# Patient Record
Sex: Female | Born: 1955
Health system: Southern US, Community
[De-identification: ages and names within clinical notes are randomized; demographics above are authoritative.]

## PROBLEM LIST (undated history)

## (undated) DIAGNOSIS — R011 Cardiac murmur, unspecified: Secondary | ICD-10-CM

## (undated) DIAGNOSIS — M810 Age-related osteoporosis without current pathological fracture: Secondary | ICD-10-CM

## (undated) DIAGNOSIS — I10 Essential (primary) hypertension: Secondary | ICD-10-CM

## (undated) DIAGNOSIS — E785 Hyperlipidemia, unspecified: Secondary | ICD-10-CM

## (undated) HISTORY — PX: OTHER SURGICAL HISTORY: SHX169

## (undated) HISTORY — DX: Hyperlipidemia, unspecified: E78.5

## (undated) HISTORY — DX: Age-related osteoporosis without current pathological fracture: M81.0

## (undated) HISTORY — DX: Essential (primary) hypertension: I10

## (undated) HISTORY — PX: BREAST EXCISIONAL BIOPSY: SUR124

## (undated) HISTORY — PX: COLONOSCOPY: SHX174

## (undated) HISTORY — DX: Cardiac murmur, unspecified: R01.1

---

## 2009-02-26 HISTORY — PX: COLONOSCOPY: SHX174

## 2012-09-06 ENCOUNTER — Other Ambulatory Visit (INDEPENDENT_AMBULATORY_CARE_PROVIDER_SITE_OTHER): Payer: BC Managed Care – PPO

## 2012-09-06 DIAGNOSIS — Z0000 Encounter for general adult medical examination without abnormal findings: Secondary | ICD-10-CM

## 2012-09-06 LAB — COMPLETE METABOLIC PANEL WITH GFR
AST: 17 U/L (ref 0–37)
Albumin: 4 g/dL (ref 3.5–5.2)
Alkaline Phosphatase: 55 U/L (ref 39–117)
BUN: 12 mg/dL (ref 6–23)
Calcium: 9.5 mg/dL (ref 8.4–10.5)
Chloride: 104 mEq/L (ref 96–112)
Creat: 0.68 mg/dL (ref 0.50–1.10)
GFR, Est Non African American: >89 mL/min
Glucose, Bld: 92 mg/dL (ref 70–99)
Potassium: 4.4 mEq/L (ref 3.5–5.3)

## 2012-09-06 NOTE — Progress Notes (Signed)
Patient came in for labs only.

## 2012-09-09 LAB — NMR LIPOPROFILE WITH LIPIDS
HDL Size: 10 nm (ref >=9.2)
LDL (calc): 87 mg/dL (ref <100)
LDL Particle Number: 976 nmol/L (ref <1000)
LDL Size: 20.8 nm (ref >20.5)
LP-IR Score: 32 (ref <=45)
Large VLDL-P: 1.8 nmol/L (ref <=2.7)
Small LDL Particle Number: 379 nmol/L (ref <=527)
VLDL Size: 46.7 nm — ABNORMAL HIGH (ref <=46.6)

## 2012-10-03 ENCOUNTER — Other Ambulatory Visit: Payer: Self-pay | Admitting: Nurse Practitioner

## 2012-10-25 ENCOUNTER — Encounter: Payer: Self-pay | Admitting: Nurse Practitioner

## 2012-10-25 ENCOUNTER — Ambulatory Visit (INDEPENDENT_AMBULATORY_CARE_PROVIDER_SITE_OTHER): Payer: BC Managed Care – PPO | Admitting: Nurse Practitioner

## 2012-10-25 VITALS — BP 129/66 | HR 69 | Temp 99.2°F | Ht 65.0 in | Wt 148.0 lb

## 2012-10-25 DIAGNOSIS — I10 Essential (primary) hypertension: Secondary | ICD-10-CM | POA: Insufficient documentation

## 2012-10-25 DIAGNOSIS — Z716 Tobacco abuse counseling: Secondary | ICD-10-CM

## 2012-10-25 DIAGNOSIS — E785 Hyperlipidemia, unspecified: Secondary | ICD-10-CM

## 2012-10-25 DIAGNOSIS — Z7189 Other specified counseling: Secondary | ICD-10-CM

## 2012-10-25 DIAGNOSIS — Z13820 Encounter for screening for osteoporosis: Secondary | ICD-10-CM

## 2012-10-25 DIAGNOSIS — E7800 Pure hypercholesterolemia, unspecified: Secondary | ICD-10-CM | POA: Insufficient documentation

## 2012-10-25 LAB — HM DEXA SCAN: HM DEXA SCAN: NORMAL

## 2012-10-25 MED ORDER — VARENICLINE TARTRATE 1 MG PO TABS: 1.0000 mg | ORAL_TABLET | Freq: Two times a day (BID) | ORAL | Status: DC

## 2012-10-25 MED ORDER — SIMVASTATIN 40 MG PO TABS: 40.0000 mg | ORAL_TABLET | Freq: Every day | ORAL | Status: DC

## 2012-10-25 MED ORDER — LISINOPRIL-HYDROCHLOROTHIAZIDE 10-12.5 MG PO TABS: 1.0000 | ORAL_TABLET | Freq: Every day | ORAL | Status: DC

## 2012-10-25 MED ORDER — VARENICLINE TARTRATE 0.5 MG X 11 & 1 MG X 42 PO MISC: ORAL | Status: DC

## 2012-10-25 NOTE — Patient Instructions (Signed)

## 2012-10-25 NOTE — Progress Notes (Signed)
Subjective:    Patient ID: Julie Wiggins, female    DOB: May 25, 1955, 57 y.o.   MRN: [id number]  Hypertension This is a chronic problem. The current episode started more than 1 year ago. The problem has been resolved since onset. The problem is controlled. Pertinent negatives include no blurred vision, chest pain, headaches, neck pain, orthopnea, palpitations, peripheral edema or shortness of breath. Risk factors for coronary artery disease include dyslipidemia. Past treatments include ACE inhibitors and diuretics. The current treatment provides mild improvement.  Hyperlipidemia This is a chronic problem. The current episode started more than 1 year ago. The problem is uncontrolled. Recent lipid tests were reviewed and are low. She has no history of diabetes, hypothyroidism, liver disease, obesity or nephrotic syndrome. Pertinent negatives include no chest pain or shortness of breath. Current antihyperlipidemic treatment includes statins. The current treatment provides moderate improvement of lipids. Compliance problems include adherence to diet.  Risk factors for coronary artery disease include hypertension.      Review of Systems  HENT: Negative for neck pain.   Eyes: Negative for blurred vision.  Respiratory: Negative for shortness of breath.   Cardiovascular: Negative for chest pain, palpitations and orthopnea.  Neurological: Negative for headaches.  All other systems reviewed and are negative.       Objective:   Physical Exam  Constitutional: She is oriented to person, place, and time. She appears well-developed and well-nourished.  HENT:  Nose: Nose normal.  Mouth/Throat: Oropharynx is clear and moist.  Eyes: EOM are normal.  Neck: Trachea normal, normal range of motion and full passive range of motion without pain. Neck supple. No JVD present. Carotid bruit is not present. No thyromegaly present.  Cardiovascular: Normal rate, regular rhythm, normal heart sounds and intact distal  pulses.  Exam reveals no gallop and no friction rub.   No murmur heard. Pulmonary/Chest: Effort normal and breath sounds normal.  Abdominal: Soft. Bowel sounds are normal. She exhibits no distension and no mass. There is no tenderness.  Musculoskeletal: Normal range of motion.  Lymphadenopathy:    She has no cervical adenopathy.  Neurological: She is alert and oriented to person, place, and time. She has normal reflexes.  Skin: Skin is warm and dry.  Psychiatric: She has a normal mood and affect. Her behavior is normal. Judgment and thought content normal.    BP 129/66  Pulse 69  Temp(Src) 99.2 F (37.3 C) (Oral)  Ht 5\' 5"  (1.651 m)  Wt 148 lb (67.132 kg)  BMI 24.63 kg/m2 Results for orders placed in visit on 09/06/12  COMPLETE METABOLIC PANEL WITH GFR      Result Value Range   Sodium 139  135 - 145 mEq/L   Potassium 4.4  3.5 - 5.3 mEq/L   Chloride 104  96 - 112 mEq/L   CO2 29  19 - 32 mEq/L   Glucose, Bld 92  70 - 99 mg/dL   BUN 12  6 - 23 mg/dL   Creat 4.09  8.11 - 9.14 mg/dL   Total Bilirubin 0.4  0.3 - 1.2 mg/dL   Alkaline Phosphatase 55  39 - 117 U/L   AST 17  0 - 37 U/L   ALT 10  0 - 35 U/L   Total Protein 6.7  6.0 - 8.3 g/dL   Albumin 4.0  3.5 - 5.2 g/dL   Calcium 9.5  8.4 - 78.2 mg/dL   GFR, Est African American >89     GFR, Est Non African American >89  NMR LIPOPROFILE WITH LIPIDS      Result Value Range   LDL Particle Number [id number]  <1000 nmol/L   LDL (calc) 87  <100 mg/dL   HDL-C 66  >=95 mg/dL   Triglycerides 62  <621 mg/dL   Cholesterol, Total 308  <200 mg/dL   HDL Particle Number [id number]  >=84.6 umol/L   Large HDL-P 12.8  >=4.8 umol/L   Large VLDL-P 1.8  <=2.7 nmol/L   Small LDL Particle Number [id number]  <=527 nmol/L   LDL Size 20.8  >20.5 nm   HDL Size 10.0  >=9.2 nm   VLDL Size 46.7 (*) <=46.6 nm   LP-IR Score 32  <=45         Assessment & Plan:  1. Hypertension Low NA+ diet - CMP14+EGFR - lisinopril-hydrochlorothiazide (PRINZIDE,ZESTORETIC)  10-12.5 MG per tablet; Take 1 tablet by mouth daily.  Dispense: 30 tablet; Refill: 5  2. Hyperlipidemia Low fat diet an dexercsie - NMR, lipoprofile - simvastatin (ZOCOR) 40 MG tablet; Take 1 tablet (40 mg total) by mouth at bedtime.  Dispense: 30 tablet; Refill: 5 Health maintenance reviewed hemocult cards given Schedule dexa  3. Smoking cessation Chantix starter pak and maintenance pak Smoke the first 13 days of starting med then stop cold [place]  Bennie Pierini, FNP

## 2012-12-11 ENCOUNTER — Ambulatory Visit (INDEPENDENT_AMBULATORY_CARE_PROVIDER_SITE_OTHER): Payer: BC Managed Care – PPO | Admitting: Pharmacist

## 2012-12-11 ENCOUNTER — Ambulatory Visit (INDEPENDENT_AMBULATORY_CARE_PROVIDER_SITE_OTHER): Payer: BC Managed Care – PPO

## 2012-12-11 VITALS — Ht 65.0 in | Wt 141.0 lb

## 2012-12-11 DIAGNOSIS — M899 Disorder of bone, unspecified: Secondary | ICD-10-CM

## 2012-12-11 DIAGNOSIS — M8580 Other specified disorders of bone density and structure, unspecified site: Secondary | ICD-10-CM

## 2012-12-11 DIAGNOSIS — M810 Age-related osteoporosis without current pathological fracture: Secondary | ICD-10-CM | POA: Insufficient documentation

## 2012-12-11 DIAGNOSIS — Z13820 Encounter for screening for osteoporosis: Secondary | ICD-10-CM

## 2012-12-11 NOTE — Patient Instructions (Addendum)

## 2012-12-11 NOTE — Progress Notes (Signed)
Patient ID: Julie Wiggins, female   DOB: 1955/07/18, 57 y.o.   MRN: [id number]  [facility] Current Height: Height: 5\' 5"  (165.1 cm)      Max Lifetime Height:  5\' 5"  Current Weight: Weight: 141 lb (63.957 kg)       Ethnicity:Caucasian     HPI: Does pt already have a diagnosis of:  Osteopenia?  Yes Osteoporosis?  No  Back Pain?  No       Kyphosis?  No Prior fracture?  No Med(s) for Osteoporosis/Osteopenia:  none Med(s) previously tried for Osteoporosis/Osteopenia:  Boniva but stopped due to concern about ONJ.                                                             PMH: Age at menopause:  Patient unsure Hysterectomy?  No Oophorectomy?  No HRT? No  - but previously used Depo Provera for about 10 years. Steroid Use?  No Thyroid med?  No History of cancer?  No History of digestive disorders (ie Crohn's)?  No Current or previous eating disorders?  No Last Vitamin D Result:  37 (04/2011) Last GFR Result:  Greater than 89 (08/2012)   FH/SH: Family history of osteoporosis?  No Parent with history of hip fracture?  No Family history of breast cancer?  No Exercise?  No Smoking?  Yes but is trying to quit - has Chantix RX Alcohol?  No    Calcium Assessment Calcium Intake  # of servings/day  Calcium mg  Milk (8 oz) 0  x  300  = 0  Yogurt (4 oz) 0 x  200 = 0  Cheese (1 oz) 0.5 x  200 = 100mg   Other Calcium sources   250mg   Ca supplement 500mg  = 500mg    Estimated calcium intake per day 850mg     DEXA Results Date of Test T-Score for AP Spine L1-L4 T-Score for Total Left Hip T-Score for Total Right Hip  12/11/2012 0.0 -2.2 -1.8  09/24/2006 -0.2 -2.0 -1.6             FRAX 10 year estimate: Total FX risk:  8.3%  (consider medication if >/= 20%) Hip FX risk:  2.0%  (consider medication if >/= 3%)  Assessment: Osteopenia with decrease BMD in hips, low calcium intake and current smoker  Recommendations: 1.  Discussed fracture risk and DEXA results.  She is trying to  make TLC changes to help with BMD.  Discussed the benefits of smoking cessation on BMD. 2.  recommend calcium 1200mg  daily through supplementation or diet.  3.  continue weight bearing exercise - 30 minutes at least 4 days  per week.   4.  Counseled and educated about fall risk and prevention.  Recheck DEXA:  2 years  Time spent counseling patient:  25 minutes  Henrene Pastor, PharmD, CPP

## 2013-06-23 ENCOUNTER — Other Ambulatory Visit: Payer: Self-pay | Admitting: *Deleted

## 2013-06-23 DIAGNOSIS — I10 Essential (primary) hypertension: Secondary | ICD-10-CM

## 2013-06-23 DIAGNOSIS — E785 Hyperlipidemia, unspecified: Secondary | ICD-10-CM

## 2013-06-23 MED ORDER — LISINOPRIL-HYDROCHLOROTHIAZIDE 10-12.5 MG PO TABS: 1.0000 | ORAL_TABLET | Freq: Every day | ORAL | Status: DC

## 2013-06-23 MED ORDER — SIMVASTATIN 40 MG PO TABS: 40.0000 mg | ORAL_TABLET | Freq: Every day | ORAL | Status: DC

## 2013-06-23 NOTE — Progress Notes (Unsigned)
Patient has appt on 4/ 21 can we please refill until then

## 2013-07-08 ENCOUNTER — Ambulatory Visit (INDEPENDENT_AMBULATORY_CARE_PROVIDER_SITE_OTHER): Payer: BC Managed Care – PPO | Admitting: Nurse Practitioner

## 2013-07-08 ENCOUNTER — Encounter (INDEPENDENT_AMBULATORY_CARE_PROVIDER_SITE_OTHER): Payer: Self-pay

## 2013-07-08 ENCOUNTER — Encounter: Payer: Self-pay | Admitting: Nurse Practitioner

## 2013-07-08 VITALS — BP 127/69 | HR 69 | Temp 98.6°F | Ht 65.0 in | Wt 146.6 lb

## 2013-07-08 DIAGNOSIS — M8580 Other specified disorders of bone density and structure, unspecified site: Secondary | ICD-10-CM

## 2013-07-08 DIAGNOSIS — I10 Essential (primary) hypertension: Secondary | ICD-10-CM

## 2013-07-08 DIAGNOSIS — M899 Disorder of bone, unspecified: Secondary | ICD-10-CM

## 2013-07-08 DIAGNOSIS — E785 Hyperlipidemia, unspecified: Secondary | ICD-10-CM

## 2013-07-08 DIAGNOSIS — M949 Disorder of cartilage, unspecified: Secondary | ICD-10-CM

## 2013-07-08 NOTE — Progress Notes (Signed)
  Subjective:    Patient ID: Julie Wiggins, female    DOB: Mar 27, 1955, 58 y.o.   MRN: [id number]  Patient here today for follow up of chronic medical problems. Has appointment with "[facility]" next week to have some test run- She will bring [place] the resilts.  Hypertension This is a chronic problem. The current episode started more than 1 year ago. The problem has been resolved since onset. The problem is controlled. Pertinent negatives include no blurred vision, chest pain, headaches, neck pain, orthopnea, palpitations, peripheral edema or shortness of breath. Risk factors for coronary artery disease include dyslipidemia. Past treatments include ACE inhibitors and diuretics. The current treatment provides mild improvement.  Hyperlipidemia This is a chronic problem. The current episode started more than 1 year ago. The problem is uncontrolled. Recent lipid tests were reviewed and are low. She has no history of diabetes, hypothyroidism, liver disease, obesity or nephrotic syndrome. Pertinent negatives include no chest pain or shortness of breath. Current antihyperlipidemic treatment includes statins. The current treatment provides moderate improvement of lipids. Compliance problems include adherence to diet.  Risk factors for coronary artery disease include hypertension.  Smoker Patient has no desire to quit smoking   Review of Systems  Eyes: Negative for blurred vision.  Respiratory: Negative for shortness of breath.   Cardiovascular: Negative for chest pain, palpitations and orthopnea.  Musculoskeletal: Negative for neck pain.  Neurological: Negative for headaches.  All other systems reviewed and are negative.      Objective:   Physical Exam  Constitutional: She is oriented to person, place, and time. She appears well-developed and well-nourished.  HENT:  Nose: Nose normal.  Mouth/Throat: Oropharynx is clear and moist.  Eyes: EOM are normal.  Neck: Trachea normal, normal range of motion  and full passive range of motion without pain. Neck supple. No JVD present. Carotid bruit is not present. No thyromegaly present.  Cardiovascular: Normal rate, regular rhythm, normal heart sounds and intact distal pulses.  Exam reveals no gallop and no friction rub.   No murmur heard. Pulmonary/Chest: Effort normal and breath sounds normal.  Abdominal: Soft. Bowel sounds are normal. She exhibits no distension and no mass. There is no tenderness.  Musculoskeletal: Normal range of motion.  Lymphadenopathy:    She has no cervical adenopathy.  Neurological: She is alert and oriented to person, place, and time. She has normal reflexes.  Skin: Skin is warm and dry.  Psychiatric: She has a normal mood and affect. Her behavior is normal. Judgment and thought content normal.    BP 127/69  Pulse 69  Temp(Src) 98.6 F (37 C) (Oral)  Ht $R'5\' 5"'XS$  (1.651 m)  Wt 146 lb 9.6 oz (66.497 kg)  BMI 24.40 kg/m2        Assessment & Plan:   1. Osteopenia   2. Hypertension   3. Hyperlipidemia    Orders Placed This Encounter  Procedures  . HM DEXA SCAN    This external order was created through the Results Console.  . CMP14+EGFR  . NMR, lipoprofile  hemoccult cards given to patient with directions Smoking cessation encouraged Labs pending Health maintenance reviewed Diet and exercise encouraged Continue all meds Follow up  In 3 months   [place], FNP

## 2013-07-08 NOTE — Patient Instructions (Signed)

## 2013-07-09 ENCOUNTER — Encounter: Payer: Self-pay | Admitting: *Deleted

## 2013-07-09 LAB — CMP14+EGFR
ALT: 12 IU/L (ref 0–32)
AST: 19 IU/L (ref 0–40)
Albumin/Globulin Ratio: 1.7 (ref 1.1–2.5)
Albumin: 4.1 g/dL (ref 3.5–5.5)
Alkaline Phosphatase: 75 IU/L (ref 39–117)
BILIRUBIN TOTAL: 0.2 mg/dL (ref 0.0–1.2)
BUN/Creatinine Ratio: 16 (ref 9–23)
BUN: 10 mg/dL (ref 6–24)
CALCIUM: 9.6 mg/dL (ref 8.7–10.2)
CHLORIDE: 101 mmol/L (ref 97–108)
CO2: 27 mmol/L (ref 18–29)
Creatinine, Ser: 0.61 mg/dL (ref 0.57–1.00)
GFR calc non Af Amer: 102 mL/min/{1.73_m2} (ref >59)
GFR, EST AFRICAN AMERICAN: 118 mL/min/{1.73_m2} (ref >59)
Globulin, Total: 2.4 g/dL (ref 1.5–4.5)
Glucose: 85 mg/dL (ref 65–99)
Potassium: 4.3 mmol/L (ref 3.5–5.2)
Sodium: 141 mmol/L (ref 134–144)
TOTAL PROTEIN: 6.5 g/dL (ref 6.0–8.5)

## 2013-07-09 LAB — NMR, LIPOPROFILE
Cholesterol: 154 mg/dL (ref <200)
HDL Cholesterol by NMR: 70 mg/dL (ref >=40)
HDL Particle Number: 35.9 umol/L (ref >=30.5)
LDL PARTICLE NUMBER: 669 nmol/L (ref <1000)
LDL Size: 20.6 nm (ref >20.5)
LDLC SERPL CALC-MCNC: 71 mg/dL (ref <100)
LP-IR Score: <25 (ref <=45)
Small LDL Particle Number: 223 nmol/L (ref <=527)
TRIGLYCERIDES BY NMR: 66 mg/dL (ref <150)

## 2013-07-09 NOTE — Progress Notes (Signed)
Quick Note:  Copy of labs sent to patient ______ 

## 2013-11-25 ENCOUNTER — Ambulatory Visit (INDEPENDENT_AMBULATORY_CARE_PROVIDER_SITE_OTHER): Payer: BC Managed Care – PPO | Admitting: Family Medicine

## 2013-11-25 ENCOUNTER — Encounter: Payer: Self-pay | Admitting: Family Medicine

## 2013-11-25 VITALS — BP 123/64 | HR 70 | Temp 98.3°F | Ht 65.0 in | Wt 137.0 lb

## 2013-11-25 DIAGNOSIS — R109 Unspecified abdominal pain: Secondary | ICD-10-CM

## 2013-11-25 MED ORDER — CIPROFLOXACIN HCL 500 MG PO TABS: 500.0000 mg | ORAL_TABLET | Freq: Two times a day (BID) | ORAL | Status: DC

## 2013-11-25 NOTE — Progress Notes (Signed)
   Subjective:    Patient ID: [name], female    DOB: 1955-03-29, 58 y.o.   MRN: [id number]  HPI  C/o abdominal discomfort in LLQ and diarrhea.  Review of Systems C/o abdominal pain and diarrhea No chest pain, SOB, HA, dizziness, vision change, N/V, diarrhea, constipation, dysuria, urinary urgency or frequency, myalgias, arthralgias or rash.     Objective:   Physical Exam Vital signs noted  Well developed well nourished female.  HEENT - Head atraumatic Normocephalic                Eyes - PERRLA, Conjuctiva - clear Sclera- Clear EOMI                Ears - EAC's Wnl TM's Wnl Gross Hearing WNL                Throat - oropharanx wnl Respiratory - Lungs CTA bilateral Cardiac - RRR S1 and S2 without murmur GI - Abdomen soft tender LLQ w/o guarding or peritoneal signs          Assessment & Plan:  Abdominal pain, unspecified site - Plan: ciprofloxacin (CIPRO) 500 MG tablet Po bid x 7 days #14 Follow up prn  Lysbeth Penner FNP

## 2013-11-26 ENCOUNTER — Ambulatory Visit (INDEPENDENT_AMBULATORY_CARE_PROVIDER_SITE_OTHER): Payer: BC Managed Care – PPO | Admitting: Family Medicine

## 2013-11-26 VITALS — BP 134/63 | HR 68 | Temp 97.5°F | Ht 65.0 in | Wt 136.0 lb

## 2013-11-26 DIAGNOSIS — K921 Melena: Secondary | ICD-10-CM

## 2013-11-26 LAB — POCT CBC
Granulocyte percent: 48.3 %G (ref 37–80)
HCT, POC: 38.3 % (ref 37.7–47.9)
Hemoglobin: 12.9 g/dL (ref 12.2–16.2)
Lymph, poc: 4.1 — AB (ref 0.6–3.4)
MCH, POC: 32.1 pg — AB (ref 27–31.2)
MCHC: 33.6 g/dL (ref 31.8–35.4)
MCV: 95.5 fL (ref 80–97)
MPV: 7.6 fL (ref 0–99.8)
POC Granulocyte: 4.3 (ref 2–6.9)
POC LYMPH PERCENT: 45.4 %L (ref 10–50)
Platelet Count, POC: 277 10*3/uL (ref 142–424)
RBC: 4 M/uL — AB (ref 4.04–5.48)
RDW, POC: 12.5 %
WBC: 9 10*3/uL (ref 4.6–10.2)

## 2013-11-26 MED ORDER — HYDROCORTISONE ACETATE 25 MG RE SUPP: 25.0000 mg | Freq: Two times a day (BID) | RECTAL | Status: DC

## 2013-11-26 NOTE — Progress Notes (Signed)
   Subjective:    Patient ID: Julie Wiggins, female    DOB: 05/20/1955, 58 y.o.   MRN: [id number]  HPI This 58 y.o. female presents for evaluation of blood in stool.  She has been having diarrhea and lower abdominal discomfort for a week and she was seen yesterday and tx'd with cipro and her discomfort has resolved but she saw blood in the toilet after a bm.   Review of Systems C/o blood in stool and diarrhea   No chest pain, SOB, HA, dizziness, vision change, N/V, constipation, dysuria, urinary urgency or frequency, myalgias, arthralgias or rash.  Objective:   Physical Exam  Vital signs noted  Well developed well nourished female.  HEENT - Head atraumatic Normocephalic                Eyes - PERRLA, Conjuctiva - clear Sclera- Clear EOMI                Ears - EAC's Wnl TM's Wnl Gross Hearing WNL                 Throat - oropharanx wnl Respiratory - Lungs CTA bilateral Cardiac - RRR S1 and S2 without murmur GI - Abdomen soft Nontender and bowel sounds active x 4 Extremities - No edema. Neuro - Grossly intact.     Assessment & Plan:  Hematochezia - Plan: POCT CBC, hydrocortisone (ANUSOL-HC) 25 MG suppository  Lysbeth Penner FNP

## 2013-12-09 ENCOUNTER — Telehealth: Payer: Self-pay | Admitting: Family Medicine

## 2013-12-09 NOTE — Telephone Encounter (Signed)
Patient wants referral to GI for diarrhea and abdominal pain she saw for this on 9/8. Patient would like appt asap. Patient would prefer [place] if possible.

## 2014-01-09 ENCOUNTER — Ambulatory Visit (INDEPENDENT_AMBULATORY_CARE_PROVIDER_SITE_OTHER): Payer: BC Managed Care – PPO | Admitting: Nurse Practitioner

## 2014-01-09 ENCOUNTER — Encounter: Payer: Self-pay | Admitting: Nurse Practitioner

## 2014-01-09 ENCOUNTER — Ambulatory Visit (INDEPENDENT_AMBULATORY_CARE_PROVIDER_SITE_OTHER): Payer: BC Managed Care – PPO

## 2014-01-09 VITALS — BP 122/69 | HR 80 | Temp 98.0°F | Ht 65.0 in | Wt 134.0 lb

## 2014-01-09 DIAGNOSIS — Z720 Tobacco use: Secondary | ICD-10-CM

## 2014-01-09 DIAGNOSIS — F17200 Nicotine dependence, unspecified, uncomplicated: Secondary | ICD-10-CM

## 2014-01-09 DIAGNOSIS — I10 Essential (primary) hypertension: Secondary | ICD-10-CM

## 2014-01-09 DIAGNOSIS — M8580 Other specified disorders of bone density and structure, unspecified site: Secondary | ICD-10-CM

## 2014-01-09 DIAGNOSIS — E785 Hyperlipidemia, unspecified: Secondary | ICD-10-CM

## 2014-01-09 MED ORDER — SIMVASTATIN 40 MG PO TABS: 40.0000 mg | ORAL_TABLET | Freq: Every day | ORAL | Status: DC

## 2014-01-09 MED ORDER — LISINOPRIL-HYDROCHLOROTHIAZIDE 10-12.5 MG PO TABS: 1.0000 | ORAL_TABLET | Freq: Every day | ORAL | Status: DC

## 2014-01-09 NOTE — Patient Instructions (Signed)
Smoking Cessation Quitting smoking is important to your health and has many advantages. However, it is not always easy to quit since nicotine is a very addictive drug. Oftentimes, people try 3 times or more before being able to quit. This document explains the best ways for you to prepare to quit smoking. Quitting takes hard work and a lot of effort, but you can do it. ADVANTAGES OF QUITTING SMOKING  You will live longer, feel better, and live better.  Your body will feel the impact of quitting smoking almost immediately.  Within 20 minutes, blood pressure decreases. Your pulse returns to its normal level.  After 8 hours, carbon monoxide levels in the blood return to normal. Your oxygen level increases.  After 24 hours, the chance of having a heart attack starts to decrease. Your breath, hair, and body stop smelling like smoke.  After 48 hours, damaged nerve endings begin to recover. Your sense of taste and smell improve.  After 72 hours, the body is virtually free of nicotine. Your bronchial tubes relax and breathing becomes easier.  After 2 to 12 weeks, lungs can hold more air. Exercise becomes easier and circulation improves.  The risk of having a heart attack, stroke, cancer, or lung disease is greatly reduced.  After 1 year, the risk of coronary heart disease is cut in half.  After 5 years, the risk of stroke falls to the same as a nonsmoker.  After 10 years, the risk of lung cancer is cut in half and the risk of other cancers decreases significantly.  After 15 years, the risk of coronary heart disease drops, usually to the level of a nonsmoker.  If you are pregnant, quitting smoking will improve your chances of having a healthy baby.  The people you live with, especially any children, will be healthier.  You will have extra money to spend on things other than cigarettes. QUESTIONS TO THINK ABOUT BEFORE ATTEMPTING TO QUIT You may want to talk about your answers with your  health care provider.  Why do you want to quit?  If you tried to quit in the past, what helped and what did not?  What will be the most difficult situations for you after you quit? How will you plan to handle them?  Who can help you through the tough times? Your family? Friends? A health care provider?  What pleasures do you get from smoking? What ways can you still get pleasure if you quit? Here are some questions to ask your health care provider:  How can you help me to be successful at quitting?  What medicine do you think would be best for me and how should I take it?  What should I do if I need more help?  What is smoking withdrawal like? How can I get information on withdrawal? GET READY  Set a quit date.  Change your environment by getting rid of all cigarettes, ashtrays, matches, and lighters in your home, car, or work. Do not let people smoke in your home.  Review your past attempts to quit. Think about what worked and what did not. GET SUPPORT AND ENCOURAGEMENT You have a better chance of being successful if you have help. You can get support in many ways.  Tell your family, friends, and coworkers that you are going to quit and need their support. Ask them not to smoke around you.  Get individual, group, or telephone counseling and support. Programs are available at local hospitals and health centers. Call   your local health department for information about programs in your area.  Spiritual beliefs and practices may help some smokers quit.  Download a "quit meter" on your computer to keep track of quit statistics, such as how long you have gone without smoking, cigarettes not smoked, and money saved.  Get a self-help book about quitting smoking and staying off tobacco. LEARN NEW SKILLS AND BEHAVIORS  Distract yourself from urges to smoke. Talk to someone, go for a walk, or occupy your time with a task.  Change your normal routine. Take a different route to work.  Drink tea instead of coffee. Eat breakfast in a different place.  Reduce your stress. Take a hot bath, exercise, or read a book.  Plan something enjoyable to do every day. Reward yourself for not smoking.  Explore interactive web-based programs that specialize in helping you quit. GET MEDICINE AND USE IT CORRECTLY Medicines can help you stop smoking and decrease the urge to smoke. Combining medicine with the above behavioral methods and support can greatly increase your chances of successfully quitting smoking.  Nicotine replacement therapy helps deliver nicotine to your body without the negative effects and risks of smoking. Nicotine replacement therapy includes nicotine gum, lozenges, inhalers, nasal sprays, and skin patches. Some may be available over-the-counter and others require a prescription.  Antidepressant medicine helps people abstain from smoking, but how this works is unknown. This medicine is available by prescription.  Nicotinic receptor partial agonist medicine simulates the effect of nicotine in your brain. This medicine is available by prescription. Ask your health care provider for advice about which medicines to use and how to use them based on your health history. Your health care provider will tell you what side effects to look out for if you choose to be on a medicine or therapy. Carefully read the information on the package. Do not use any other product containing nicotine while using a nicotine replacement product.  RELAPSE OR DIFFICULT SITUATIONS Most relapses occur within the first 3 months after quitting. Do not be discouraged if you start smoking again. Remember, most people try several times before finally quitting. You may have symptoms of withdrawal because your body is used to nicotine. You may crave cigarettes, be irritable, feel very hungry, cough often, get headaches, or have difficulty concentrating. The withdrawal symptoms are only temporary. They are strongest  when you first quit, but they will go away within 10-14 days. To reduce the chances of relapse, try to:  Avoid drinking alcohol. Drinking lowers your chances of successfully quitting.  Reduce the amount of caffeine you consume. Once you quit smoking, the amount of caffeine in your body increases and can give you symptoms, such as a rapid heartbeat, sweating, and anxiety.  Avoid smokers because they can make you want to smoke.  Do not let weight gain distract you. Many smokers will gain weight when they quit, usually less than 10 pounds. Eat a healthy diet and stay active. You can always lose the weight gained after you quit.  Find ways to improve your mood other than smoking. FOR MORE INFORMATION  www.smokefree.gov  Document Released: 02/28/2001 Document Revised: 07/21/2013 Document Reviewed: 06/15/2011 [employer] Patient Information 2015 [employer]. This information is not intended to replace advice given to you by your health care provider. Make sure you discuss any questions you have with your health care provider.  

## 2014-01-09 NOTE — Progress Notes (Signed)
  Subjective:    Patient ID: Julie Wiggins, female    DOB: [birth year], 58 y.o.   MRN: [id number]  Patient here today for follow up of chronic medical problems. No complaints today   Hypertension This is a chronic problem. The current episode started more than 1 year ago. The problem has been resolved since onset. The problem is controlled. Pertinent negatives include no blurred vision, chest pain, headaches, neck pain, orthopnea, palpitations, peripheral edema or shortness of breath. Risk factors for coronary artery disease include dyslipidemia. Past treatments include ACE inhibitors and diuretics. The current treatment provides mild improvement.  Hyperlipidemia This is a chronic problem. The current episode started more than 1 year ago. The problem is uncontrolled. Recent lipid tests were reviewed and are low. She has no history of diabetes, hypothyroidism, liver disease, obesity or nephrotic syndrome. Pertinent negatives include no chest pain or shortness of breath. Current antihyperlipidemic treatment includes statins. The current treatment provides moderate improvement of lipids. Compliance problems include adherence to diet.  Risk factors for coronary artery disease include hypertension.  Smoker Patient has no desire to quit smoking but has been unsuccessful Osteopenia No meds- very little weight bearing exercise     Review of Systems  Eyes: Negative for blurred vision.  Respiratory: Negative for shortness of breath.   Cardiovascular: Negative for chest pain, palpitations and orthopnea.  Musculoskeletal: Negative for neck pain.  Neurological: Negative for headaches.  All other systems reviewed and are negative.      Objective:   Physical Exam  Constitutional: She is oriented to person, place, and time. She appears well-developed and well-nourished.  HENT:  Nose: Nose normal.  Mouth/Throat: Oropharynx is clear and moist.  Eyes: EOM are normal.  Neck: Trachea normal, normal range of  motion and full passive range of motion without pain. Neck supple. No JVD present. Carotid bruit is not present. No thyromegaly present.  Cardiovascular: Normal rate, regular rhythm, normal heart sounds and intact distal pulses.  Exam reveals no gallop and no friction rub.   No murmur heard. Pulmonary/Chest: Effort normal and breath sounds normal.  Abdominal: Soft. Bowel sounds are normal. She exhibits no distension and no mass. There is no tenderness.  Musculoskeletal: Normal range of motion.  Lymphadenopathy:    She has no cervical adenopathy.  Neurological: She is alert and oriented to person, place, and time. She has normal reflexes.  Skin: Skin is warm and dry.  Psychiatric: She has a normal mood and affect. Her behavior is normal. Judgment and thought content normal.    BP 122/69  Pulse 80  Temp(Src) 98 F (36.7 C) (Oral)  Ht _0  (1.651 m)  Wt 134 lb (60.782 kg)  BMI 22.30 kg/m2  EKG-NSR with PVC-Mary-Margaret Hassell Done, FNP   Chest xray- bronchitic changes-Preliminary reading by Ronnald Collum, FNP  [facility]     Assessment & Plan:   1. Osteopenia Weight bearing exercises  2. Essential hypertension Low Na diet - CMP14+EGFR - EKG 12-Lead - lisinopril-hydrochlorothiazide (PRINZIDE,ZESTORETIC) 10-12.5 MG per tablet; Take 1 tablet by mouth daily.  Dispense: 90 tablet; Refill: 1  3. Hyperlipidemia Low Fat diet - NMR, lipoprofile - simvastatin (ZOCOR) 40 MG tablet; Take 1 tablet (40 mg total) by mouth at bedtime.  Dispense: 90 tablet; Refill: 1  4. Smoker STOP smoking - DG Chest 2 View; Future   Labs pending Health maintenance reviewed Diet and exercise encouraged Continue all meds Follow up  In 3 months   [place], FNP

## 2014-01-10 LAB — NMR, LIPOPROFILE
CHOLESTEROL: 162 mg/dL (ref 100–199)
HDL Cholesterol by NMR: 71 mg/dL (ref >39)
HDL Particle Number: 34.7 umol/L (ref >=30.5)
LDL Particle Number: 764 nmol/L (ref <1000)
LDL Size: 20.8 nm (ref >20.5)
LDLC SERPL CALC-MCNC: 73 mg/dL (ref 0–99)
LP-IR Score: 30 (ref <=45)
Small LDL Particle Number: 262 nmol/L (ref <=527)
Triglycerides by NMR: 90 mg/dL (ref 0–149)

## 2014-01-10 LAB — CMP14+EGFR
A/G RATIO: 1.7 (ref 1.1–2.5)
ALT: 7 IU/L (ref 0–32)
AST: 19 IU/L (ref 0–40)
Albumin: 4.5 g/dL (ref 3.5–5.5)
Alkaline Phosphatase: 85 IU/L (ref 39–117)
BUN/Creatinine Ratio: 8 — ABNORMAL LOW (ref 9–23)
BUN: 6 mg/dL (ref 6–24)
CALCIUM: 9.7 mg/dL (ref 8.7–10.2)
CO2: 25 mmol/L (ref 18–29)
Chloride: 96 mmol/L — ABNORMAL LOW (ref 97–108)
Creatinine, Ser: 0.72 mg/dL (ref 0.57–1.00)
GFR calc Af Amer: 109 mL/min/{1.73_m2} (ref >59)
GFR calc non Af Amer: 95 mL/min/{1.73_m2} (ref >59)
GLUCOSE: 88 mg/dL (ref 65–99)
Globulin, Total: 2.7 g/dL (ref 1.5–4.5)
Potassium: 3.6 mmol/L (ref 3.5–5.2)
Sodium: 140 mmol/L (ref 134–144)
TOTAL PROTEIN: 7.2 g/dL (ref 6.0–8.5)
Total Bilirubin: 0.3 mg/dL (ref 0.0–1.2)

## 2014-10-14 ENCOUNTER — Ambulatory Visit (INDEPENDENT_AMBULATORY_CARE_PROVIDER_SITE_OTHER): Payer: BLUE CROSS/BLUE SHIELD | Admitting: Nurse Practitioner

## 2014-10-14 ENCOUNTER — Encounter: Payer: Self-pay | Admitting: Nurse Practitioner

## 2014-10-14 VITALS — BP 124/67 | HR 67 | Temp 97.8°F | Ht 65.0 in | Wt 135.0 lb

## 2014-10-14 DIAGNOSIS — M8580 Other specified disorders of bone density and structure, unspecified site: Secondary | ICD-10-CM

## 2014-10-14 DIAGNOSIS — Z1212 Encounter for screening for malignant neoplasm of rectum: Secondary | ICD-10-CM | POA: Diagnosis not present

## 2014-10-14 DIAGNOSIS — E785 Hyperlipidemia, unspecified: Secondary | ICD-10-CM | POA: Diagnosis not present

## 2014-10-14 DIAGNOSIS — I10 Essential (primary) hypertension: Secondary | ICD-10-CM | POA: Diagnosis not present

## 2014-10-14 DIAGNOSIS — R0989 Other specified symptoms and signs involving the circulatory and respiratory systems: Secondary | ICD-10-CM

## 2014-10-14 MED ORDER — SIMVASTATIN 40 MG PO TABS: 40.0000 mg | ORAL_TABLET | Freq: Every day | ORAL | Status: DC

## 2014-10-14 MED ORDER — LISINOPRIL-HYDROCHLOROTHIAZIDE 10-12.5 MG PO TABS: 1.0000 | ORAL_TABLET | Freq: Every day | ORAL | Status: DC

## 2014-10-14 NOTE — Progress Notes (Signed)
Subjective:    Patient ID: Julie Wiggins, female    DOB: Mar 30, 1955, 59 y.o.   MRN: [id number]  Patient here today for follow up of chronic medical problems. No complaints today   Hypertension This is a chronic problem. The current episode started more than 1 year ago. The problem is unchanged. The problem is controlled. Associated symptoms include sweats. Pertinent negatives include no chest pain, headaches, neck pain, palpitations or shortness of breath. Risk factors for coronary artery disease include dyslipidemia, post-menopausal state and sedentary lifestyle. Past treatments include ACE inhibitors and diuretics. The current treatment provides moderate improvement. Compliance problems include diet and exercise.  There is no history of CAD/MI or CVA.  Hyperlipidemia This is a chronic problem. The current episode started more than 1 year ago. The problem is controlled. She has no history of diabetes, hypothyroidism or obesity. Pertinent negatives include no chest pain or shortness of breath. Current antihyperlipidemic treatment includes statins. The current treatment provides moderate improvement of lipids. Compliance problems include adherence to diet and adherence to exercise.  Risk factors for coronary artery disease include dyslipidemia, hypertension, post-menopausal and a sedentary lifestyle.  Smoker Patient has no desire to quit smoking but has been unsuccessful Osteopenia No meds- very little weight bearing exercise     Review of Systems  Respiratory: Negative for shortness of breath.   Cardiovascular: Negative for chest pain and palpitations.  Musculoskeletal: Negative for neck pain.  Neurological: Negative for headaches.  All other systems reviewed and are negative.      Objective:   Physical Exam  Constitutional: She is oriented to person, place, and time. She appears well-developed and well-nourished.  HENT:  Nose: Nose normal.  Mouth/Throat: Oropharynx is clear and moist.   Eyes: EOM are normal.  Neck: Trachea normal, normal range of motion and full passive range of motion without pain. Neck supple. No JVD present. Carotid bruit is not present. No thyromegaly present.  Cardiovascular: Normal rate, regular rhythm, normal heart sounds and intact distal pulses.  Exam reveals no gallop and no friction rub.   No murmur heard. Right carotid bruit  Pulmonary/Chest: Effort normal and breath sounds normal.  Abdominal: Soft. Bowel sounds are normal. She exhibits no distension and no mass. There is no tenderness.  Musculoskeletal: Normal range of motion.  Lymphadenopathy:    She has no cervical adenopathy.  Neurological: She is alert and oriented to person, place, and time. She has normal reflexes.  Skin: Skin is warm and dry.  Psychiatric: She has a normal mood and affect. Her behavior is normal. Judgment and thought content normal.    BP 124/67 mmHg  Pulse 67  Temp(Src) 97.8 F (36.6 C) (Oral)  Ht _0  (1.651 m)  Wt 135 lb (61.236 kg)  BMI 22.47 kg/m2     Assessment & Plan:   1. Essential hypertension Do not add salt to diet - lisinopril-hydrochlorothiazide (PRINZIDE,ZESTORETIC) 10-12.5 MG per tablet; Take 1 tablet by mouth daily.  Dispense: 90 tablet; Refill: 1 - CMP14+EGFR  2. Osteopenia Weight nearing exercises  3. Hyperlipidemia Low fat diet - simvastatin (ZOCOR) 40 MG tablet; Take 1 tablet (40 mg total) by mouth at bedtime.  Dispense: 90 tablet; Refill: 1 - Lipid panel  4. Bruit of right carotid artery - US Carotid Duplex Bilateral; Future  5. Screening for malignant neoplasm of the rectum - Fecal occult blood, imunochemical; Future    Labs pending Health maintenance reviewed Diet and exercise encouraged Continue all meds Follow up  In 3  months   Mary-Margaret Abdo Denault, FNP    

## 2014-10-14 NOTE — Patient Instructions (Signed)

## 2014-10-15 LAB — CMP14+EGFR
A/G RATIO: 1.5 (ref 1.1–2.5)
ALK PHOS: 79 IU/L (ref 39–117)
ALT: 7 IU/L (ref 0–32)
AST: 15 IU/L (ref 0–40)
Albumin: 4 g/dL (ref 3.5–5.5)
BUN/Creatinine Ratio: 14 (ref 9–23)
BUN: 9 mg/dL (ref 6–24)
Bilirubin Total: 0.4 mg/dL (ref 0.0–1.2)
CO2: 25 mmol/L (ref 18–29)
Calcium: 9.4 mg/dL (ref 8.7–10.2)
Chloride: 99 mmol/L (ref 97–108)
Creatinine, Ser: 0.66 mg/dL (ref 0.57–1.00)
GFR calc Af Amer: 114 mL/min/{1.73_m2} (ref >59)
GFR calc non Af Amer: 99 mL/min/{1.73_m2} (ref >59)
GLUCOSE: 79 mg/dL (ref 65–99)
Globulin, Total: 2.7 g/dL (ref 1.5–4.5)
Potassium: 4.3 mmol/L (ref 3.5–5.2)
Sodium: 139 mmol/L (ref 134–144)
Total Protein: 6.7 g/dL (ref 6.0–8.5)

## 2014-10-15 LAB — LIPID PANEL
CHOLESTEROL TOTAL: 168 mg/dL (ref 100–199)
Chol/HDL Ratio: 2.5 ratio units (ref 0.0–4.4)
HDL: 67 mg/dL (ref >39)
LDL Calculated: 87 mg/dL (ref 0–99)
Triglycerides: 68 mg/dL (ref 0–149)
VLDL CHOLESTEROL CAL: 14 mg/dL (ref 5–40)

## 2014-10-21 ENCOUNTER — Ambulatory Visit (HOSPITAL_COMMUNITY): Payer: Self-pay

## 2014-10-22 ENCOUNTER — Ambulatory Visit (HOSPITAL_COMMUNITY)
Admission: RE | Admit: 2014-10-22 | Discharge: 2014-10-22 | Disposition: A | Discharge status: Home / Self Care | Payer: BLUE CROSS/BLUE SHIELD | Source: Ambulatory Visit | Attending: Nurse Practitioner | Admitting: Nurse Practitioner

## 2014-10-22 DIAGNOSIS — R0989 Other specified symptoms and signs involving the circulatory and respiratory systems: Secondary | ICD-10-CM | POA: Diagnosis not present

## 2014-10-22 DIAGNOSIS — E785 Hyperlipidemia, unspecified: Secondary | ICD-10-CM | POA: Diagnosis not present

## 2014-10-22 DIAGNOSIS — Z720 Tobacco use: Secondary | ICD-10-CM | POA: Diagnosis not present

## 2014-11-26 LAB — HM MAMMOGRAPHY: HM MAMMO: NEGATIVE

## 2015-04-24 ENCOUNTER — Ambulatory Visit (INDEPENDENT_AMBULATORY_CARE_PROVIDER_SITE_OTHER): Payer: BLUE CROSS/BLUE SHIELD | Admitting: Family Medicine

## 2015-04-24 VITALS — BP 128/64 | HR 76 | Temp 97.1°F | Ht 65.0 in | Wt 136.0 lb

## 2015-04-24 DIAGNOSIS — M659 Synovitis and tenosynovitis, unspecified: Secondary | ICD-10-CM

## 2015-04-24 DIAGNOSIS — I10 Essential (primary) hypertension: Secondary | ICD-10-CM | POA: Diagnosis not present

## 2015-04-24 DIAGNOSIS — Z23 Encounter for immunization: Secondary | ICD-10-CM

## 2015-04-24 DIAGNOSIS — M778 Other enthesopathies, not elsewhere classified: Secondary | ICD-10-CM

## 2015-04-24 MED ORDER — PREDNISONE 10 MG PO TABS: ORAL_TABLET | ORAL | Status: DC

## 2015-04-24 MED ORDER — DICLOFENAC SODIUM 75 MG PO TBEC: 75.0000 mg | DELAYED_RELEASE_TABLET | Freq: Two times a day (BID) | ORAL | Status: DC

## 2015-04-24 NOTE — Patient Instructions (Addendum)
Wear brace for 6 weeks for all activities. For the first couple of weeks it may be useful to use it overnight as well.  Used prednisone as directed for 15 days. Wait until you have finished the prednisone before starting on the diclofenac.  Please schedule a blood pressure and cholesterol checkup soon, at your convenience.

## 2015-04-24 NOTE — Progress Notes (Signed)
   Subjective:  Patient ID: Julie Wiggins, female    DOB: April 28, 1955  Age: 60 y.o. MRN: [id number]  CC: Elbow Pain   HPI Mitze Deshmukh presents for pain at the left elbow for several weeks slowly increasing. She has not had any injury. However she notes that it feels like the extensor tendinitis/tennis elbow that she had about 15 years ago. She does repetitive activity involving flexion and extension and supination pronation of the hands frequently at work. She is right-handed. Pain is moderate in severity.  History Starlina has a past medical history of Hyperlipidemia and Hypertension.   She has no past surgical history on file.   Her family history is not on file.She reports that she has been smoking.  She does not have any smokeless tobacco history on file. She reports that she drinks alcohol. She reports that she does not use illicit drugs.  Current Outpatient Prescriptions on File Prior to Visit  Medication Sig Dispense Refill  . Calcium Citrate-Vitamin D (CITRACAL PETITES/VITAMIN D) 200-250 MG-UNIT TABS Take 2 each by mouth daily.    Marland Kitchen lisinopril-hydrochlorothiazide (PRINZIDE,ZESTORETIC) 10-12.5 MG per tablet Take 1 tablet by mouth daily. 90 tablet 1  . simvastatin (ZOCOR) 40 MG tablet Take 1 tablet (40 mg total) by mouth at bedtime. 90 tablet 1   No current facility-administered medications on file prior to visit.    ROS Review of Systems As per history of present illness Objective:  BP 128/64 mmHg  Pulse 76  Temp(Src) 97.1 F (36.2 C) (Oral)  Ht 5\' 5"  (1.651 m)  Wt 136 lb (61.689 kg)  BMI 22.63 kg/m2  SpO2 99%  Physical Exam  Constitutional: She is oriented to person, place, and time. She appears well-developed and well-nourished.  HENT:  Head: Normocephalic.  Cardiovascular: Normal rate and regular rhythm.   Pulmonary/Chest: Effort normal and breath sounds normal.  Musculoskeletal: Normal range of motion. She exhibits tenderness (noted with palpation, ROM and resistance for  left wrist common extensor origin. NV intact LUE).  Neurological: She is alert and oriented to person, place, and time.    Assessment & Plan:   Hannahrose was seen today for elbow pain.  Diagnoses and all orders for this visit:  Left elbow tendinitis -     Elbow brace  Encounter for immunization  Essential hypertension  Other orders -     Flu Vaccine QUAD 36+ mos IM -     diclofenac (VOLTAREN) 75 MG EC tablet; Take 1 tablet (75 mg total) by mouth 2 (two) times daily. -     predniSONE (DELTASONE) 10 MG tablet; Take 5 daily for 3 days followed by 4,3,2 and 1 for 3 days each.   I am having Ms. Shelp start on diclofenac and predniSONE. I am also having her maintain her Calcium Citrate-Vitamin D, lisinopril-hydrochlorothiazide, and simvastatin.  Meds ordered this encounter  Medications  . diclofenac (VOLTAREN) 75 MG EC tablet    Sig: Take 1 tablet (75 mg total) by mouth 2 (two) times daily.    Dispense:  60 tablet    Refill:  2  . predniSONE (DELTASONE) 10 MG tablet    Sig: Take 5 daily for 3 days followed by 4,3,2 and 1 for 3 days each.    Dispense:  45 tablet    Refill:  0     Follow-up: Return if symptoms worsen or fail to improve.  Claretta Fraise, M.D.

## 2015-05-07 ENCOUNTER — Encounter: Payer: Self-pay | Admitting: *Deleted

## 2015-07-06 ENCOUNTER — Ambulatory Visit: Payer: BLUE CROSS/BLUE SHIELD | Admitting: Nurse Practitioner

## 2015-07-07 ENCOUNTER — Encounter: Payer: Self-pay | Admitting: Nurse Practitioner

## 2015-07-08 ENCOUNTER — Ambulatory Visit (INDEPENDENT_AMBULATORY_CARE_PROVIDER_SITE_OTHER): Payer: BLUE CROSS/BLUE SHIELD | Admitting: Nurse Practitioner

## 2015-07-08 ENCOUNTER — Encounter: Payer: Self-pay | Admitting: Nurse Practitioner

## 2015-07-08 ENCOUNTER — Ambulatory Visit (INDEPENDENT_AMBULATORY_CARE_PROVIDER_SITE_OTHER): Payer: BLUE CROSS/BLUE SHIELD

## 2015-07-08 VITALS — BP 124/65 | HR 66 | Temp 98.0°F | Ht 65.0 in | Wt 139.2 lb

## 2015-07-08 DIAGNOSIS — F17200 Nicotine dependence, unspecified, uncomplicated: Secondary | ICD-10-CM

## 2015-07-08 DIAGNOSIS — Z1159 Encounter for screening for other viral diseases: Secondary | ICD-10-CM | POA: Diagnosis not present

## 2015-07-08 DIAGNOSIS — M8580 Other specified disorders of bone density and structure, unspecified site: Secondary | ICD-10-CM

## 2015-07-08 DIAGNOSIS — Z1212 Encounter for screening for malignant neoplasm of rectum: Secondary | ICD-10-CM | POA: Diagnosis not present

## 2015-07-08 DIAGNOSIS — Z720 Tobacco use: Secondary | ICD-10-CM

## 2015-07-08 DIAGNOSIS — I10 Essential (primary) hypertension: Secondary | ICD-10-CM | POA: Diagnosis not present

## 2015-07-08 DIAGNOSIS — E785 Hyperlipidemia, unspecified: Secondary | ICD-10-CM

## 2015-07-08 MED ORDER — LISINOPRIL-HYDROCHLOROTHIAZIDE 10-12.5 MG PO TABS: 1.0000 | ORAL_TABLET | Freq: Every day | ORAL | Status: DC

## 2015-07-08 MED ORDER — SIMVASTATIN 40 MG PO TABS: 40.0000 mg | ORAL_TABLET | Freq: Every day | ORAL | Status: DC

## 2015-07-08 NOTE — Patient Instructions (Signed)
Smoking Cessation, Tips for Success If you are ready to quit smoking, congratulations! You have chosen to help yourself be healthier. Cigarettes bring nicotine, tar, carbon monoxide, and other irritants into your body. Your lungs, heart, and blood vessels will be able to work better without these poisons. There are many different ways to quit smoking. Nicotine gum, nicotine patches, a nicotine inhaler, or nicotine nasal spray can help with physical craving. Hypnosis, support groups, and medicines help break the habit of smoking. WHAT THINGS CAN I DO TO MAKE QUITTING EASIER?  Here are some tips to help you quit for good:  Pick a date when you will quit smoking completely. Tell all of your friends and family about your plan to quit on that date.  Do not try to slowly cut down on the number of cigarettes you are smoking. Pick a quit date and quit smoking completely starting on that day.  Throw away all cigarettes.   Clean and remove all ashtrays from your home, work, and car.  On a card, write down your reasons for quitting. Carry the card with you and read it when you get the urge to smoke.  Cleanse your body of nicotine. Drink enough water and fluids to keep your urine clear or pale yellow. Do this after quitting to flush the nicotine from your body.  Learn to predict your moods. Do not let a bad situation be your excuse to have a cigarette. Some situations in your life might tempt you into wanting a cigarette.  Never have "just one" cigarette. It leads to wanting another and another. Remind yourself of your decision to quit.  Change habits associated with smoking. If you smoked while driving or when feeling stressed, try other activities to replace smoking. Stand up when drinking your coffee. Brush your teeth after eating. Sit in a different chair when you read the paper. Avoid alcohol while trying to quit, and try to drink fewer caffeinated beverages. Alcohol and caffeine may urge you to  smoke.  Avoid foods and drinks that can trigger a desire to smoke, such as sugary or spicy foods and alcohol.  Ask people who smoke not to smoke around you.  Have something planned to do right after eating or having a cup of coffee. For example, plan to take a walk or exercise.  Try a relaxation exercise to calm you down and decrease your stress. Remember, you may be tense and nervous for the first 2 weeks after you quit, but this will pass.  Find new activities to keep your hands busy. Play with a pen, coin, or rubber band. Doodle or draw things on paper.  Brush your teeth right after eating. This will help cut down on the craving for the taste of tobacco after meals. You can also try mouthwash.   Use oral substitutes in place of cigarettes. Try using lemon drops, carrots, cinnamon sticks, or chewing gum. Keep them handy so they are available when you have the urge to smoke.  When you have the urge to smoke, try deep breathing.  Designate your home as a nonsmoking area.  If you are a heavy smoker, ask your health care provider about a prescription for nicotine chewing gum. It can ease your withdrawal from nicotine.  Reward yourself. Set aside the cigarette money you save and buy yourself something nice.  Look for support from others. Join a support group or smoking cessation program. Ask someone at home or at work to help you with your plan   to quit smoking.  Always ask yourself, "Do I need this cigarette or is this just a reflex?" Tell yourself, "Today, I choose not to smoke," or "I do not want to smoke." You are reminding yourself of your decision to quit.  Do not replace cigarette smoking with electronic cigarettes (commonly called e-cigarettes). The safety of e-cigarettes is unknown, and some may contain harmful chemicals.  If you relapse, do not give up! Plan ahead and think about what you will do the next time you get the urge to smoke. HOW WILL I FEEL WHEN I QUIT SMOKING? You  may have symptoms of withdrawal because your body is used to nicotine (the addictive substance in cigarettes). You may crave cigarettes, be irritable, feel very hungry, cough often, get headaches, or have difficulty concentrating. The withdrawal symptoms are only temporary. They are strongest when you first quit but will go away within 10-14 days. When withdrawal symptoms occur, stay in control. Think about your reasons for quitting. Remind yourself that these are signs that your body is healing and getting used to being without cigarettes. Remember that withdrawal symptoms are easier to treat than the major diseases that smoking can cause.  Even after the withdrawal is over, expect periodic urges to smoke. However, these cravings are generally short lived and will go away whether you smoke or not. Do not smoke! WHAT RESOURCES ARE AVAILABLE TO HELP ME QUIT SMOKING? Your health care provider can direct you to community resources or hospitals for support, which may include:  Group support.  Education.  Hypnosis.  Therapy.   This information is not intended to replace advice given to you by your health care provider. Make sure you discuss any questions you have with your health care provider.   Document Released: 12/03/2003 Document Revised: 03/27/2014 Document Reviewed: 08/22/2012 Elsevier Interactive Patient Education 2016 [employer]  

## 2015-07-08 NOTE — Progress Notes (Signed)
Subjective:    Patient ID: Julie Wiggins, female    DOB: 03/19/1956, 59 y.o.   MRN: [id number]  Patient here today for follow up of chronic medical problems.  Outpatient Encounter Prescriptions as of 07/08/2015  Medication Sig  . Calcium Citrate-Vitamin D (CITRACAL PETITES/VITAMIN D) 200-250 MG-UNIT TABS Take 2 each by mouth daily.  Marland Kitchen lisinopril-hydrochlorothiazide (PRINZIDE,ZESTORETIC) 10-12.5 MG per tablet Take 1 tablet by mouth daily.  . simvastatin (ZOCOR) 40 MG tablet Take 1 tablet (40 mg total) by mouth at bedtime.     Hypertension This is a chronic problem. The current episode started more than 1 year ago. The problem is unchanged. The problem is controlled. Associated symptoms include sweats. Pertinent negatives include no chest pain, headaches, neck pain, palpitations or shortness of breath. Risk factors for coronary artery disease include dyslipidemia, post-menopausal state and sedentary lifestyle. Past treatments include ACE inhibitors and diuretics. The current treatment provides moderate improvement. Compliance problems include diet and exercise.  There is no history of CAD/MI or CVA.  Hyperlipidemia This is a chronic problem. The current episode started more than 1 year ago. The problem is controlled. She has no history of diabetes, hypothyroidism or obesity. Pertinent negatives include no chest pain or shortness of breath. Current antihyperlipidemic treatment includes statins. The current treatment provides moderate improvement of lipids. Compliance problems include adherence to diet and adherence to exercise.  Risk factors for coronary artery disease include dyslipidemia, hypertension, post-menopausal and a sedentary lifestyle.  Smoker Patient has no desire to quit smoking but has been unsuccessful Osteopenia No meds- very little weight bearing exercise     Review of Systems  Respiratory: Negative for shortness of breath.   Cardiovascular: Negative for chest pain and  palpitations.  Musculoskeletal: Negative for neck pain.  Neurological: Negative for headaches.  All other systems reviewed and are negative.      Objective:   Physical Exam  Constitutional: She is oriented to person, place, and time. She appears well-developed and well-nourished.  HENT:  Nose: Nose normal.  Mouth/Throat: Oropharynx is clear and moist.  Eyes: EOM are normal.  Neck: Trachea normal, normal range of motion and full passive range of motion without pain. Neck supple. No JVD present. Carotid bruit is not present. No thyromegaly present.  Cardiovascular: Normal rate, regular rhythm, normal heart sounds and intact distal pulses.  Exam reveals no gallop and no friction rub.   No murmur heard. Right carotid bruit  Pulmonary/Chest: Effort normal and breath sounds normal.  Abdominal: Soft. Bowel sounds are normal. She exhibits no distension and no mass. There is no tenderness.  Musculoskeletal: Normal range of motion.  Lymphadenopathy:    She has no cervical adenopathy.  Neurological: She is alert and oriented to person, place, and time. She has normal reflexes.  Skin: Skin is warm and dry.  Psychiatric: She has a normal mood and affect. Her behavior is normal. Judgment and thought content normal.    BP 124/65 mmHg  Pulse 66  Temp(Src) 98 F (36.7 C) (Oral)  Ht _0  (1.651 m)  Wt 139 lb 3.2 oz (63.141 kg)  BMI 23.16 kg/m2  EKG- NSR- Mary-Margaret Hassell Done, FNP   Chest x ray-chronic bronchitic change-Preliminary reading by Ronnald Collum, FNP  [facility]     Assessment & Plan:   1. Essential hypertension Do not add salt to diet - lisinopril-hydrochlorothiazide (PRINZIDE,ZESTORETIC) 10-12.5 MG tablet; Take 1 tablet by mouth daily.  Dispense: 90 tablet; Refill: 1 - CMP14+EGFR - EKG 12-Lead  2. Osteopenia Weight  bearing exercises  3. Hyperlipidemia Low fat diet - simvastatin (ZOCOR) 40 MG tablet; Take 1 tablet (40 mg total) by mouth at bedtime.  Dispense: 90 tablet;  Refill: 1 - Lipid panel  4. Screening for malignant neoplasm of the rectum - Fecal occult blood, imunochemical; Future  5. Need for hepatitis C screening test - Hepatitis C antibody  6. Smoker Smoking cessation encouraged - DG Chest 2 View; Future    Labs pending Health maintenance reviewed Diet and exercise encouraged Continue all meds Follow up  In 6 months   [place], FNP

## 2015-07-09 LAB — CMP14+EGFR
ALBUMIN: 3.9 g/dL (ref 3.5–5.5)
ALT: 8 IU/L (ref 0–32)
AST: 15 IU/L (ref 0–40)
Albumin/Globulin Ratio: 1.6 (ref 1.2–2.2)
Alkaline Phosphatase: 83 IU/L (ref 39–117)
BUN / CREAT RATIO: 12 (ref 9–23)
BUN: 8 mg/dL (ref 6–24)
Bilirubin Total: 0.3 mg/dL (ref 0.0–1.2)
CALCIUM: 9.4 mg/dL (ref 8.7–10.2)
CO2: 24 mmol/L (ref 18–29)
CREATININE: 0.65 mg/dL (ref 0.57–1.00)
Chloride: 100 mmol/L (ref 96–106)
GFR calc Af Amer: 114 mL/min/{1.73_m2} (ref >59)
GFR, EST NON AFRICAN AMERICAN: 99 mL/min/{1.73_m2} (ref >59)
Globulin, Total: 2.5 g/dL (ref 1.5–4.5)
Glucose: 81 mg/dL (ref 65–99)
Potassium: 4.1 mmol/L (ref 3.5–5.2)
SODIUM: 141 mmol/L (ref 134–144)
Total Protein: 6.4 g/dL (ref 6.0–8.5)

## 2015-07-09 LAB — LIPID PANEL
CHOL/HDL RATIO: 2.9 ratio (ref 0.0–4.4)
Cholesterol, Total: 183 mg/dL (ref 100–199)
HDL: 64 mg/dL (ref >39)
LDL CALC: 100 mg/dL — AB (ref 0–99)
Triglycerides: 97 mg/dL (ref 0–149)
VLDL Cholesterol Cal: 19 mg/dL (ref 5–40)

## 2015-07-09 LAB — HEPATITIS C ANTIBODY

## 2015-08-04 ENCOUNTER — Other Ambulatory Visit: Payer: Self-pay | Admitting: Nurse Practitioner

## 2015-11-18 DIAGNOSIS — H04123 Dry eye syndrome of bilateral lacrimal glands: Secondary | ICD-10-CM | POA: Diagnosis not present

## 2015-11-18 DIAGNOSIS — H5210 Myopia, unspecified eye: Secondary | ICD-10-CM | POA: Diagnosis not present

## 2015-11-18 DIAGNOSIS — H40033 Anatomical narrow angle, bilateral: Secondary | ICD-10-CM | POA: Diagnosis not present

## 2015-12-01 DIAGNOSIS — G44219 Episodic tension-type headache, not intractable: Secondary | ICD-10-CM | POA: Diagnosis not present

## 2015-12-29 ENCOUNTER — Encounter: Payer: Self-pay | Admitting: Pediatrics

## 2015-12-29 ENCOUNTER — Ambulatory Visit (INDEPENDENT_AMBULATORY_CARE_PROVIDER_SITE_OTHER): Payer: BLUE CROSS/BLUE SHIELD | Admitting: Pediatrics

## 2015-12-29 VITALS — BP 118/59 | HR 66 | Temp 97.5°F | Ht 65.0 in | Wt 138.6 lb

## 2015-12-29 DIAGNOSIS — I10 Essential (primary) hypertension: Secondary | ICD-10-CM

## 2015-12-29 DIAGNOSIS — Z23 Encounter for immunization: Secondary | ICD-10-CM

## 2015-12-29 DIAGNOSIS — Z0000 Encounter for general adult medical examination without abnormal findings: Secondary | ICD-10-CM | POA: Diagnosis not present

## 2015-12-29 DIAGNOSIS — E785 Hyperlipidemia, unspecified: Secondary | ICD-10-CM

## 2015-12-29 DIAGNOSIS — Z720 Tobacco use: Secondary | ICD-10-CM | POA: Insufficient documentation

## 2015-12-29 MED ORDER — SIMVASTATIN 40 MG PO TABS: 40.0000 mg | ORAL_TABLET | Freq: Every day | ORAL | 1 refills | Status: DC

## 2015-12-29 MED ORDER — LISINOPRIL-HYDROCHLOROTHIAZIDE 10-12.5 MG PO TABS: 1.0000 | ORAL_TABLET | Freq: Every day | ORAL | 1 refills | Status: DC

## 2015-12-29 NOTE — Progress Notes (Signed)
  Subjective:   Patient ID: Julie Wiggins, female    DOB: 04-Oct-1955, 60 y.o.   MRN: [id number] CC: Annual Exam and Gynecologic Exam  HPI: Julie Wiggins is a 60 y.o. female presenting for Annual Exam and Gynecologic Exam  Pap smear: last apprx 3 yrs ago H/o abnormal pap years ago, had cryo therpay, normal since then  HTN: No HA, CP Taking BP daily  No vaginal  Not sexually active Husband died 06/28/2007, had ALS  Working at [employer] there  Feeling well otherwise Continues to smoke 1ppd Smokes inside Has quit before, not ready now   Relevant past medical, surgical, family and social history reviewed. Allergies and medications reviewed and updated. History  Smoking Status  . Current Every Day Smoker  Smokeless Tobacco  . Never Used   ROS: Per HPI   Objective:    BP (!) 118/59   Pulse 66   Temp 97.5 F (36.4 C) (Oral)   Ht 5\' 5"  (1.651 m)   Wt 138 lb 9.6 oz (62.9 kg)   BMI 23.06 kg/m   Wt Readings from Last 3 Encounters:  12/29/15 138 lb 9.6 oz (62.9 kg)  07/08/15 139 lb 3.2 oz (63.1 kg)  04/24/15 136 lb (61.7 kg)    Gen: NAD, alert, cooperative with exam, NCAT EYES: EOMI, no conjunctival injection, or no icterus ENT:  TMs pearly gray b/l, OP without erythema LYMPH: no cervical LAD CV: NRRR, normal S1/S2, soft II/VI RUSB systolic ejection murmur.  Resp: CTABL, no wheezes, normal WOB Abd: +BS, soft, NTND. no guarding or organomegaly Ext: No edema, warm Neuro: Alert and oriented, strength equal b/l UE and LE, coordination grossly normal MSK: normal muscle bulk GU: atrophic vaginal walls, cervical os present, normal ext female genitalia  Assessment & Plan:  Elenamarie was seen today for annual exam and gynecologic exam.  Diagnoses and all orders for this visit:  Encounter for preventive health examination -     Pap IG and HPV (high risk) DNA detection  Essential hypertension Labs next visit, normal 6 mo ago -     lisinopril-hydrochlorothiazide  (PRINZIDE,ZESTORETIC) 10-12.5 MG tablet; Take 1 tablet by mouth daily.  Hyperlipidemia, unspecified hyperlipidemia type No side effects, cont med -     simvastatin (ZOCOR) 40 MG tablet; Take 1 tablet (40 mg total) by mouth at bedtime.  Tobacco use Cont to encourage cessation, pt precontemplative, discussed cessation strategies  Follow up plan: Return in about 6 months (around 06/28/2016) for chronic f/u. [name], MD [place]

## 2016-01-04 LAB — PAP IG AND HPV HIGH-RISK: PAP SMEAR COMMENT: 0

## 2016-01-04 LAB — HPV, LOW VOLUME (REFLEX): HPV, LOW VOL REFLEX: NEGATIVE

## 2016-01-10 ENCOUNTER — Telehealth: Payer: Self-pay | Admitting: *Deleted

## 2016-01-10 NOTE — Telephone Encounter (Signed)
-----   Message from Eustaquio Maize, MD sent at 01/05/2016  8:04 AM EDT ----- Please let pt know--pap smear normal

## 2016-01-13 ENCOUNTER — Telehealth: Payer: Self-pay | Admitting: Pediatrics

## 2016-01-13 NOTE — Telephone Encounter (Signed)
Patient advised that she can call and schedule her mammogram that she should not need a referral.

## 2016-01-13 NOTE — Progress Notes (Signed)
Patient aware.

## 2016-02-07 DIAGNOSIS — Z1231 Encounter for screening mammogram for malignant neoplasm of breast: Secondary | ICD-10-CM | POA: Diagnosis not present

## 2016-07-06 ENCOUNTER — Encounter: Payer: Self-pay | Admitting: Family

## 2016-07-06 ENCOUNTER — Ambulatory Visit (INDEPENDENT_AMBULATORY_CARE_PROVIDER_SITE_OTHER): Payer: BLUE CROSS/BLUE SHIELD | Admitting: Family

## 2016-07-06 VITALS — BP 115/60 | HR 75 | Temp 98.8°F | Ht 65.0 in | Wt 143.8 lb

## 2016-07-06 DIAGNOSIS — M545 Low back pain, unspecified: Secondary | ICD-10-CM

## 2016-07-06 MED ORDER — METHYLPREDNISOLONE ACETATE 80 MG/ML IJ SUSP
80.0000 mg | Freq: Once | INTRAMUSCULAR | Status: AC
  Administered 2016-07-06: 80 mg via INTRAMUSCULAR

## 2016-07-06 MED ORDER — KETOROLAC TROMETHAMINE 60 MG/2ML IM SOLN
60.0000 mg | Freq: Once | INTRAMUSCULAR | Status: AC
  Administered 2016-07-06: 60 mg via INTRAMUSCULAR

## 2016-07-06 MED ORDER — CYCLOBENZAPRINE HCL 5 MG PO TABS: 5.0000 mg | ORAL_TABLET | Freq: Three times a day (TID) | ORAL | 1 refills | Status: DC | PRN

## 2016-07-06 MED ORDER — NAPROXEN 500 MG PO TABS: 500.0000 mg | ORAL_TABLET | Freq: Two times a day (BID) | ORAL | 1 refills | Status: DC

## 2016-07-06 NOTE — Progress Notes (Signed)
   Subjective:    Patient ID: Julie Wiggins, female    DOB: May 18, 1955, 61 y.o.   MRN: [id number]  Back Pain  This is a recurrent problem. The current episode started in the past 7 days. The problem occurs constantly. The problem has been gradually worsening since onset. The pain is present in the lumbar spine. The quality of the pain is described as aching. The pain does not radiate. The pain is at a severity of 10/10. The pain is moderate. The symptoms are aggravated by sitting, standing, twisting and bending. Pertinent negatives include no bladder incontinence, bowel incontinence, dysuria, leg pain or tingling. She has tried bed rest and NSAIDs for the symptoms. The treatment provided no relief.      Review of Systems  Gastrointestinal: Negative for bowel incontinence.  Genitourinary: Negative for bladder incontinence and dysuria.  Musculoskeletal: Positive for back pain.  Neurological: Negative for tingling.  All other systems reviewed and are negative.      Objective:   Physical Exam  Constitutional: She is oriented to person, place, and time. She appears well-developed and well-nourished. No distress.  HENT:  Head: Normocephalic.  Eyes: Pupils are equal, round, and reactive to light.  Neck: Normal range of motion. Neck supple. No thyromegaly present.  Cardiovascular: Normal rate, regular rhythm, normal heart sounds and intact distal pulses.   No murmur heard. Pulmonary/Chest: Effort normal and breath sounds normal. No respiratory distress. She has no wheezes.  Abdominal: Soft. Bowel sounds are normal. She exhibits no distension. There is no tenderness.  Musculoskeletal: Normal range of motion. She exhibits tenderness. She exhibits no edema.  Full ROM of back, pain with rotation and bending, Negative SLR   Neurological: She is alert and oriented to person, place, and time.  Skin: Skin is warm and dry.  Psychiatric: She has a normal mood and affect. Her behavior is normal. Judgment  and thought content normal.  Vitals reviewed.     BP 115/60   Pulse 75   Temp 98.8 F (37.1 C) (Oral)   Ht 5\' 5"  (1.651 m)   Wt 143 lb 12.8 oz (65.2 kg)   BMI 23.93 kg/m      Assessment & Plan:  1. Bilateral low back pain without sciatica, unspecified chronicity Rest Ice and heat ROM exercises encouraged RTO prn or if symptoms worsen or do not improve - methylPREDNISolone acetate (DEPO-MEDROL) injection 80 mg; Inject 1 mL (80 mg total) into the muscle once. - ketorolac (TORADOL) injection 60 mg; Inject 2 mLs (60 mg total) into the muscle once. - naproxen (NAPROSYN) 500 MG tablet; Take 1 tablet (500 mg total) by mouth 2 (two) times daily with a meal.  Dispense: 60 tablet; Refill: 1 - cyclobenzaprine (FLEXERIL) 5 MG tablet; Take 1 tablet (5 mg total) by mouth 3 (three) times daily as needed for muscle spasms.  Dispense: 90 tablet; Refill: [place], FNP

## 2016-07-06 NOTE — Patient Instructions (Signed)

## 2016-09-29 ENCOUNTER — Other Ambulatory Visit: Payer: Self-pay | Admitting: Pediatrics

## 2016-09-29 DIAGNOSIS — E785 Hyperlipidemia, unspecified: Secondary | ICD-10-CM

## 2016-09-29 DIAGNOSIS — I10 Essential (primary) hypertension: Secondary | ICD-10-CM

## 2016-10-13 ENCOUNTER — Other Ambulatory Visit: Payer: Self-pay | Admitting: Pediatrics

## 2016-10-13 DIAGNOSIS — I10 Essential (primary) hypertension: Secondary | ICD-10-CM

## 2016-10-13 DIAGNOSIS — E785 Hyperlipidemia, unspecified: Secondary | ICD-10-CM

## 2017-01-08 ENCOUNTER — Encounter: Payer: BLUE CROSS/BLUE SHIELD | Admitting: Pediatrics

## 2017-01-09 ENCOUNTER — Encounter: Payer: Self-pay | Admitting: Pediatrics

## 2017-01-31 ENCOUNTER — Encounter: Payer: Self-pay | Admitting: Pediatrics

## 2017-01-31 ENCOUNTER — Encounter: Payer: Self-pay | Admitting: *Deleted

## 2017-01-31 ENCOUNTER — Ambulatory Visit: Payer: BLUE CROSS/BLUE SHIELD | Admitting: Pediatrics

## 2017-01-31 VITALS — BP 107/61 | HR 67 | Temp 99.3°F | Ht 65.0 in | Wt 143.2 lb

## 2017-01-31 DIAGNOSIS — Z23 Encounter for immunization: Secondary | ICD-10-CM

## 2017-01-31 DIAGNOSIS — I10 Essential (primary) hypertension: Secondary | ICD-10-CM

## 2017-01-31 DIAGNOSIS — Z720 Tobacco use: Secondary | ICD-10-CM

## 2017-01-31 DIAGNOSIS — Z0000 Encounter for general adult medical examination without abnormal findings: Secondary | ICD-10-CM

## 2017-01-31 DIAGNOSIS — F17200 Nicotine dependence, unspecified, uncomplicated: Secondary | ICD-10-CM | POA: Insufficient documentation

## 2017-01-31 DIAGNOSIS — E785 Hyperlipidemia, unspecified: Secondary | ICD-10-CM

## 2017-01-31 MED ORDER — SIMVASTATIN 40 MG PO TABS: 40.0000 mg | ORAL_TABLET | Freq: Every day | ORAL | 3 refills | Status: DC

## 2017-01-31 MED ORDER — VARENICLINE TARTRATE 0.5 MG X 11 & 1 MG X 42 PO MISC: ORAL | 0 refills | Status: DC

## 2017-01-31 MED ORDER — LISINOPRIL 10 MG PO TABS: 10.0000 mg | ORAL_TABLET | Freq: Every day | ORAL | 1 refills | Status: DC

## 2017-01-31 MED ORDER — VARENICLINE TARTRATE 1 MG PO TABS: 1.0000 mg | ORAL_TABLET | Freq: Two times a day (BID) | ORAL | 1 refills | Status: DC

## 2017-01-31 NOTE — Progress Notes (Signed)
  Subjective:   Patient ID: Julie Wiggins, female    DOB: 1955-09-10, 61 y.o.   MRN: [id number] CC: Annual Exam (No pap)  HPI: Julie Wiggins is a 61 y.o. female presenting for Annual Exam (No pap)  Overall has been feeling well Continues to smoke about a pack a day Open to quitting, has been very hard in the past Try Chantix.  Worked until she started smoking again.  Not regularly exercising outside of work No shortness of breath with exertion  Regular bowel movements Planning to call to schedule her mammogram, gets them every year  Colonoscopy due in 2020  Relevant past medical, surgical, family and social history reviewed. Allergies and medications reviewed and updated. Social History   Tobacco Use  Smoking Status Current Every Day Smoker  Smokeless Tobacco Never Used   ROS: All systems negative other than what was in the HPI  Objective:    BP 107/61   Pulse 67   Temp 99.3 F (37.4 C) (Oral)   Ht 5\' 5"  (1.651 m)   Wt 143 lb 3.2 oz (65 kg)   BMI 23.83 kg/m   Wt Readings from Last 3 Encounters:  01/31/17 143 lb 3.2 oz (65 kg)  07/06/16 143 lb 12.8 oz (65.2 kg)  12/29/15 138 lb 9.6 oz (62.9 kg)    Gen: NAD, alert, cooperative with exam, NCAT EYES: EOMI, no conjunctival injection, or no icterus ENT:  TMs pearly gray b/l, OP without erythema LYMPH: no cervical LAD CV: NRRR, normal S1/S2, no murmur, distal pulses 2+ b/l Resp: CTABL, no wheezes, normal WOB Abd: +BS, soft, NTND. no guarding or organomegaly Ext: No edema, warm Neuro: Alert and oriented, strength equal b/l UE and LE, coordination grossly normal MSK: normal muscle bulk  Assessment & Plan:  Julie Wiggins was seen today for annual exam.  Diagnoses and all orders for this visit:  Encounter for preventive health examination Due for flu shot today Discussed healthy lifestyle  Essential hypertension Stable, on low side, says it has been similar at work Stop HCTZ, continue lisinopril alone -     lisinopril  (PRINIVIL,ZESTRIL) 10 MG tablet; Take 1 tablet (10 mg total) daily by mouth.  Hyperlipidemia, unspecified hyperlipidemia type Stable continue -     simvastatin (ZOCOR) 40 MG tablet; Take 1 tablet (40 mg total) at bedtime by mouth.  Tobacco use Cessation strategies discussed Will start below two weeks before quit date when down to less than half a pack -     varenicline (CHANTIX PAK) 0.5 MG X 11 & 1 MG X 42 tablet; Take one 0.5 mg tablet by mouth once daily for 3 days, then increase to one 0.5 mg tablet twice daily for 4 days, then increase to one 1 mg tablet twice daily. -     varenicline (CHANTIX CONTINUING MONTH PAK) 1 MG tablet; Take 1 tablet (1 mg total) 2 (two) times daily by mouth.  Follow up plan: Return in about 6 months (around 07/31/2017). [name], MD [place]

## 2017-02-19 ENCOUNTER — Other Ambulatory Visit: Payer: Self-pay | Admitting: Pediatrics

## 2017-02-19 DIAGNOSIS — I10 Essential (primary) hypertension: Secondary | ICD-10-CM

## 2017-03-01 ENCOUNTER — Telehealth: Payer: Self-pay | Admitting: Pediatrics

## 2017-03-01 NOTE — Telephone Encounter (Signed)
Patient called stating that since starting different BP medicine that BP has been elevated.  Only checked readings twice since new med.  140/67 and 152/73

## 2017-06-11 DIAGNOSIS — H5210 Myopia, unspecified eye: Secondary | ICD-10-CM | POA: Diagnosis not present

## 2017-06-11 DIAGNOSIS — H5203 Hypermetropia, bilateral: Secondary | ICD-10-CM | POA: Diagnosis not present

## 2017-06-18 DIAGNOSIS — H04123 Dry eye syndrome of bilateral lacrimal glands: Secondary | ICD-10-CM | POA: Diagnosis not present

## 2017-08-28 ENCOUNTER — Other Ambulatory Visit: Payer: Self-pay | Admitting: Pediatrics

## 2017-08-28 DIAGNOSIS — I10 Essential (primary) hypertension: Secondary | ICD-10-CM

## 2017-09-10 ENCOUNTER — Telehealth: Payer: Self-pay | Admitting: Pediatrics

## 2017-09-10 DIAGNOSIS — I10 Essential (primary) hypertension: Secondary | ICD-10-CM

## 2017-09-10 DIAGNOSIS — E785 Hyperlipidemia, unspecified: Secondary | ICD-10-CM

## 2017-09-10 NOTE — Telephone Encounter (Signed)
Pt is scheduled to come in on 7/29 and will come in earlier part of July to have labs done please have orders placed

## 2017-09-28 ENCOUNTER — Other Ambulatory Visit: Payer: BLUE CROSS/BLUE SHIELD

## 2017-09-28 DIAGNOSIS — E785 Hyperlipidemia, unspecified: Secondary | ICD-10-CM | POA: Diagnosis not present

## 2017-09-28 DIAGNOSIS — I10 Essential (primary) hypertension: Secondary | ICD-10-CM | POA: Diagnosis not present

## 2017-09-28 LAB — BMP8+EGFR
BUN/Creatinine Ratio: 15 (ref 9–23)
BUN: 10 mg/dL (ref 6–24)
CHLORIDE: 103 mmol/L (ref 96–106)
CO2: 25 mmol/L (ref 20–29)
CREATININE: 0.65 mg/dL (ref 0.57–1.00)
Calcium: 9.3 mg/dL (ref 8.7–10.2)
GFR calc Af Amer: 112 mL/min/{1.73_m2} (ref >59)
GFR calc non Af Amer: 97 mL/min/{1.73_m2} (ref >59)
GLUCOSE: 76 mg/dL (ref 65–99)
POTASSIUM: 4.5 mmol/L (ref 3.5–5.2)
Sodium: 141 mmol/L (ref 134–144)

## 2017-09-28 LAB — LIPID PANEL
CHOLESTEROL TOTAL: 150 mg/dL (ref 100–199)
Chol/HDL Ratio: 2.6 ratio (ref 0.0–4.4)
HDL: 58 mg/dL (ref >39)
LDL CALC: 81 mg/dL (ref 0–99)
TRIGLYCERIDES: 57 mg/dL (ref 0–149)
VLDL Cholesterol Cal: 11 mg/dL (ref 5–40)

## 2017-10-08 ENCOUNTER — Other Ambulatory Visit: Payer: Self-pay | Admitting: Pediatrics

## 2017-10-08 DIAGNOSIS — I10 Essential (primary) hypertension: Secondary | ICD-10-CM

## 2017-10-09 NOTE — Telephone Encounter (Signed)
Last seen 01/31/17   [facility]

## 2017-10-11 ENCOUNTER — Other Ambulatory Visit: Payer: Self-pay | Admitting: Pediatrics

## 2017-10-11 DIAGNOSIS — I10 Essential (primary) hypertension: Secondary | ICD-10-CM

## 2017-10-11 NOTE — Telephone Encounter (Signed)
Last seen 01/21/17

## 2017-10-15 ENCOUNTER — Ambulatory Visit: Payer: BLUE CROSS/BLUE SHIELD | Admitting: Pediatrics

## 2017-10-15 ENCOUNTER — Encounter: Payer: Self-pay | Admitting: Pediatrics

## 2017-10-15 VITALS — BP 133/70 | HR 66 | Temp 97.0°F | Ht 65.0 in | Wt 140.4 lb

## 2017-10-15 DIAGNOSIS — E7800 Pure hypercholesterolemia, unspecified: Secondary | ICD-10-CM

## 2017-10-15 DIAGNOSIS — Z720 Tobacco use: Secondary | ICD-10-CM

## 2017-10-15 DIAGNOSIS — K5900 Constipation, unspecified: Secondary | ICD-10-CM

## 2017-10-15 DIAGNOSIS — Z1231 Encounter for screening mammogram for malignant neoplasm of breast: Secondary | ICD-10-CM | POA: Diagnosis not present

## 2017-10-15 DIAGNOSIS — I10 Essential (primary) hypertension: Secondary | ICD-10-CM

## 2017-10-15 MED ORDER — VARENICLINE TARTRATE 1 MG PO TABS: 1.0000 mg | ORAL_TABLET | Freq: Two times a day (BID) | ORAL | 1 refills | Status: DC

## 2017-10-15 MED ORDER — VARENICLINE TARTRATE 0.5 MG X 11 & 1 MG X 42 PO MISC: ORAL | 0 refills | Status: DC

## 2017-10-15 MED ORDER — LISINOPRIL 10 MG PO TABS: ORAL_TABLET | ORAL | 3 refills | Status: DC

## 2017-10-15 NOTE — Progress Notes (Addendum)
  Subjective:   Patient ID: Julie Wiggins, female    DOB: 1955/06/02, 62 y.o.   MRN: [id number] CC: No chief complaint on file.  HPI: Julie Wiggins is a 62 y.o. female   HTN: Checked a couple times at home, 099 systolic is what she remembers.  No headaches, chest pain.  Tobacco use: Ongoing.  Did not start the Chantix.  Says she has not gotten her mind ready to quit yet.  Smoking in the home in her car as well.  Hyperlipidemia: Tolerating statin, no side effects.  Constipation: not taking anything for it, sometimes goes several samples for if you give [place] a call back  Relevant past medical, surgical, family and social history reviewed. Allergies and medications reviewed and updated. Social History   Tobacco Use  Smoking Status Current Every Day Smoker  Smokeless Tobacco Never Used   ROS: Per HPI   Objective:    BP 133/70   Pulse 66   Temp (!) 97 F (36.1 C) (Oral)   Ht 5\' 5"  (1.651 m)   Wt 140 lb 6.4 oz (63.7 kg)   BMI 23.36 kg/m   Wt Readings from Last 3 Encounters:  10/15/17 140 lb 6.4 oz (63.7 kg)  01/31/17 143 lb 3.2 oz (65 kg)  07/06/16 143 lb 12.8 oz (65.2 kg)    Gen: NAD, alert, cooperative with exam, NCAT EYES: EOMI, no conjunctival injection, or no icterus ENT:  OP without erythema LYMPH: no cervical LAD CV: NRRR, normal S1/S2, no murmur, distal pulses 2+ b/l Resp: CTABL, no wheezes, normal WOB Abd: +BS, soft, NTND. no guarding or organomegaly Ext: No edema, warm Neuro: Alert and oriented, strength equal b/l UE and LE, coordination grossly normal MSK: normal muscle bulk  Assessment & Plan:  Diagnoses and all orders for this visit:  Essential hypertension Stable, continue current medicine. -     lisinopril (PRINIVIL,ZESTRIL) 10 MG tablet; TAKE 1 TABLET BY MOUTH ONCE DAILY  Tobacco use Discussed strategies, patient wants to think about starting below.  Did not start after last visit.  Asked for prescription printed. -     varenicline (CHANTIX CONTINUING MONTH  PAK) 1 MG tablet; Take 1 tablet (1 mg total) by mouth 2 (two) times daily. -     varenicline (CHANTIX PAK) 0.5 MG X 11 & 1 MG X 42 tablet; Take one 0.5 mg tablet by mouth once daily for 3 days, then increase to one 0.5 mg tablet twice daily for 4 days, then increase to one 1 mg tablet twice daily.  Hypercholesterolemia Stable, continue current medicine.  Constipation Continue drinking lots of fluids, okay to try MiraLAX.  Can give samples for Linzess in future if desired.  Follow up plan: Return in about 6 months (around 04/17/2018). [name], MD [place]

## 2017-10-15 NOTE — Addendum Note (Signed)
Addended by: Eustaquio Maize on: 10/15/2017 03:16 PM   Modules accepted: Level of Service

## 2017-10-22 ENCOUNTER — Other Ambulatory Visit: Payer: Self-pay | Admitting: Pediatrics

## 2017-10-22 DIAGNOSIS — Z1239 Encounter for other screening for malignant neoplasm of breast: Secondary | ICD-10-CM

## 2017-10-24 ENCOUNTER — Other Ambulatory Visit: Payer: Self-pay | Admitting: *Deleted

## 2017-10-24 DIAGNOSIS — Z1239 Encounter for other screening for malignant neoplasm of breast: Secondary | ICD-10-CM

## 2018-04-03 ENCOUNTER — Other Ambulatory Visit: Payer: Self-pay | Admitting: Pediatrics

## 2018-04-03 DIAGNOSIS — E785 Hyperlipidemia, unspecified: Secondary | ICD-10-CM

## 2018-04-25 ENCOUNTER — Encounter: Payer: Self-pay | Admitting: Family

## 2018-04-25 ENCOUNTER — Encounter: Payer: Self-pay | Admitting: Gastroenterology

## 2018-04-25 ENCOUNTER — Ambulatory Visit: Payer: BLUE CROSS/BLUE SHIELD | Admitting: Family

## 2018-04-25 VITALS — BP 142/68 | HR 74 | Temp 96.7°F | Ht 65.0 in | Wt 140.0 lb

## 2018-04-25 DIAGNOSIS — I10 Essential (primary) hypertension: Secondary | ICD-10-CM | POA: Diagnosis not present

## 2018-04-25 DIAGNOSIS — Z720 Tobacco use: Secondary | ICD-10-CM

## 2018-04-25 DIAGNOSIS — E7800 Pure hypercholesterolemia, unspecified: Secondary | ICD-10-CM

## 2018-04-25 DIAGNOSIS — M8580 Other specified disorders of bone density and structure, unspecified site: Secondary | ICD-10-CM | POA: Diagnosis not present

## 2018-04-25 DIAGNOSIS — Z1211 Encounter for screening for malignant neoplasm of colon: Secondary | ICD-10-CM

## 2018-04-25 MED ORDER — LISINOPRIL 10 MG PO TABS: ORAL_TABLET | ORAL | 3 refills | Status: DC

## 2018-04-25 MED ORDER — SIMVASTATIN 40 MG PO TABS: 40.0000 mg | ORAL_TABLET | Freq: Every day | ORAL | 4 refills | Status: DC

## 2018-04-25 NOTE — Patient Instructions (Signed)

## 2018-04-25 NOTE — Progress Notes (Signed)
Subjective:    Patient ID: Julie Wiggins, female    DOB: 1955-07-10, 63 y.o.   MRN: [id number] ' Chief Complaint  Patient presents with  . Medical Management of Chronic Issues   PT presents to the office today for chronic follow up. PT states she has her blood work drawn at work on 02/25/18 and brought a copy for [place].  Hypertension  This is a chronic problem. The current episode started more than 1 year ago. The problem has been waxing and waning since onset. The problem is controlled. Pertinent negatives include no headaches, malaise/fatigue, peripheral edema or shortness of breath. Risk factors for coronary artery disease include dyslipidemia and smoking/tobacco exposure. The current treatment provides moderate improvement. There is no history of kidney disease or heart failure.  Hyperlipidemia  This is a chronic problem. The current episode started more than 1 year ago. The problem is controlled. Recent lipid tests were reviewed and are normal. Pertinent negatives include no shortness of breath. Current antihyperlipidemic treatment includes statins. The current treatment provides moderate improvement of lipids. Risk factors for coronary artery disease include dyslipidemia, hypertension and a sedentary lifestyle.  Osteopenia Pt's last Dexascan was 12/11/12.     Review of Systems  Constitutional: Negative for malaise/fatigue.  Respiratory: Negative for shortness of breath.   Neurological: Negative for headaches.  All other systems reviewed and are negative.      Objective:   Physical Exam Vitals signs reviewed.  Constitutional:      General: She is not in acute distress.    Appearance: She is well-developed.  HENT:     Head: Normocephalic and atraumatic.     Right Ear: Tympanic membrane normal.     Left Ear: Tympanic membrane normal.  Eyes:     Pupils: Pupils are equal, round, and reactive to light.  Neck:     Musculoskeletal: Normal range of motion and neck supple.   Thyroid: No thyromegaly.  Cardiovascular:     Rate and Rhythm: Normal rate and regular rhythm.     Heart sounds: Normal heart sounds. No murmur.  Pulmonary:     Effort: Pulmonary effort is normal. No respiratory distress.     Breath sounds: Normal breath sounds. No wheezing.  Abdominal:     General: Bowel sounds are normal. There is no distension.     Palpations: Abdomen is soft.     Tenderness: There is no abdominal tenderness.  Musculoskeletal: Normal range of motion.        General: No tenderness.  Skin:    General: Skin is warm and dry.  Neurological:     Mental Status: She is alert and oriented to person, place, and time.     Cranial Nerves: No cranial nerve deficit.     Deep Tendon Reflexes: Reflexes are normal and symmetric.  Psychiatric:        Behavior: Behavior normal.        Thought Content: Thought content normal.        Judgment: Judgment normal.       BP (!) 142/68   Pulse 74   Temp (!) 96.7 F (35.9 C) (Oral)   Ht 5\' 5"  (1.651 m)   Wt 140 lb (63.5 kg)   BMI 23.30 kg/m      Assessment & Plan:  Airen Dales comes in today with chief complaint of Medical Management of Chronic Issues   Diagnosis and orders addressed:  1. Essential hypertension - lisinopril (PRINIVIL,ZESTRIL) 10 MG tablet; TAKE 1 TABLET BY  MOUTH ONCE DAILY  Dispense: 90 tablet; Refill: 3  2. Osteopenia, unspecified location - DG [facility] DEXA  3. Hypercholesterolemia - simvastatin (ZOCOR) 40 MG tablet; Take 1 tablet (40 mg total) by mouth at bedtime.  Dispense: 90 tablet; Refill: 4  4. Tobacco use  5. Colon cancer screening - Ambulatory referral to Gastroenterology   Labs reviewed- Will scan into chart Health Maintenance reviewed Diet and exercise encouraged  Follow up plan: 6 months    Evelina Dun, FNP

## 2018-05-02 ENCOUNTER — Ambulatory Visit (INDEPENDENT_AMBULATORY_CARE_PROVIDER_SITE_OTHER): Payer: BLUE CROSS/BLUE SHIELD

## 2018-05-02 DIAGNOSIS — M818 Other osteoporosis without current pathological fracture: Secondary | ICD-10-CM | POA: Diagnosis not present

## 2018-05-02 DIAGNOSIS — M8588 Other specified disorders of bone density and structure, other site: Secondary | ICD-10-CM | POA: Diagnosis not present

## 2018-05-06 ENCOUNTER — Other Ambulatory Visit: Payer: Self-pay | Admitting: Family

## 2018-05-06 MED ORDER — ALENDRONATE SODIUM 70 MG PO TABS: 70.0000 mg | ORAL_TABLET | ORAL | 11 refills | Status: DC

## 2018-05-27 ENCOUNTER — Ambulatory Visit (AMBULATORY_SURGERY_CENTER): Payer: Self-pay | Admitting: *Deleted

## 2018-05-27 ENCOUNTER — Other Ambulatory Visit: Payer: Self-pay

## 2018-05-27 VITALS — Ht 65.0 in | Wt 142.6 lb

## 2018-05-27 DIAGNOSIS — Z1211 Encounter for screening for malignant neoplasm of colon: Secondary | ICD-10-CM

## 2018-05-27 MED ORDER — NA SULFATE-K SULFATE-MG SULF 17.5-3.13-1.6 GM/177ML PO SOLN: ORAL | 0 refills | Status: DC

## 2018-05-27 NOTE — Progress Notes (Signed)
No egg or soy allergy  No trouble moving neck  No home oxygen or hx of sleep apnea  No diet medications  $15 off Suprep coupon given

## 2018-05-28 ENCOUNTER — Encounter: Payer: Self-pay | Admitting: Gastroenterology

## 2018-06-12 ENCOUNTER — Encounter: Payer: BLUE CROSS/BLUE SHIELD | Admitting: Gastroenterology

## 2018-07-22 ENCOUNTER — Telehealth: Payer: Self-pay | Admitting: *Deleted

## 2018-07-22 NOTE — Telephone Encounter (Signed)
LM for patient to call back to reschedule colonoscopy

## 2018-07-30 NOTE — Telephone Encounter (Signed)
Called patient, no answer. Left message for her to call us back to reschedule her colonoscopy with Dr.Armbruster.

## 2018-08-29 DIAGNOSIS — M654 Radial styloid tenosynovitis [de Quervain]: Secondary | ICD-10-CM | POA: Diagnosis not present

## 2018-09-10 ENCOUNTER — Encounter: Payer: Self-pay | Admitting: Gastroenterology

## 2018-10-30 DIAGNOSIS — M654 Radial styloid tenosynovitis [de Quervain]: Secondary | ICD-10-CM | POA: Diagnosis not present

## 2018-11-18 DIAGNOSIS — C44311 Basal cell carcinoma of skin of nose: Secondary | ICD-10-CM | POA: Diagnosis not present

## 2018-11-18 DIAGNOSIS — L820 Inflamed seborrheic keratosis: Secondary | ICD-10-CM | POA: Diagnosis not present

## 2018-11-19 HISTORY — PX: DE QUERVAIN'S RELEASE: SHX1439

## 2018-12-05 DIAGNOSIS — M654 Radial styloid tenosynovitis [de Quervain]: Secondary | ICD-10-CM | POA: Diagnosis not present

## 2018-12-10 DIAGNOSIS — I10 Essential (primary) hypertension: Secondary | ICD-10-CM | POA: Diagnosis not present

## 2018-12-10 DIAGNOSIS — M810 Age-related osteoporosis without current pathological fracture: Secondary | ICD-10-CM | POA: Diagnosis not present

## 2018-12-10 DIAGNOSIS — Z20828 Contact with and (suspected) exposure to other viral communicable diseases: Secondary | ICD-10-CM | POA: Diagnosis not present

## 2018-12-10 DIAGNOSIS — F17200 Nicotine dependence, unspecified, uncomplicated: Secondary | ICD-10-CM | POA: Diagnosis not present

## 2018-12-10 DIAGNOSIS — M654 Radial styloid tenosynovitis [de Quervain]: Secondary | ICD-10-CM | POA: Diagnosis not present

## 2018-12-10 DIAGNOSIS — Z01812 Encounter for preprocedural laboratory examination: Secondary | ICD-10-CM | POA: Diagnosis not present

## 2018-12-10 DIAGNOSIS — E785 Hyperlipidemia, unspecified: Secondary | ICD-10-CM | POA: Diagnosis not present

## 2018-12-12 DIAGNOSIS — F17200 Nicotine dependence, unspecified, uncomplicated: Secondary | ICD-10-CM | POA: Diagnosis not present

## 2018-12-12 DIAGNOSIS — M810 Age-related osteoporosis without current pathological fracture: Secondary | ICD-10-CM | POA: Diagnosis not present

## 2018-12-12 DIAGNOSIS — E785 Hyperlipidemia, unspecified: Secondary | ICD-10-CM | POA: Diagnosis not present

## 2018-12-12 DIAGNOSIS — M654 Radial styloid tenosynovitis [de Quervain]: Secondary | ICD-10-CM | POA: Diagnosis not present

## 2018-12-12 DIAGNOSIS — I10 Essential (primary) hypertension: Secondary | ICD-10-CM | POA: Diagnosis not present

## 2018-12-24 ENCOUNTER — Ambulatory Visit: Payer: BC Managed Care – PPO | Admitting: Family

## 2018-12-24 ENCOUNTER — Other Ambulatory Visit: Payer: Self-pay

## 2018-12-24 ENCOUNTER — Encounter: Payer: Self-pay | Admitting: Family

## 2018-12-24 VITALS — BP 129/66 | HR 81 | Temp 97.1°F | Ht 65.0 in | Wt 144.4 lb

## 2018-12-24 DIAGNOSIS — Z720 Tobacco use: Secondary | ICD-10-CM

## 2018-12-24 DIAGNOSIS — M810 Age-related osteoporosis without current pathological fracture: Secondary | ICD-10-CM

## 2018-12-24 DIAGNOSIS — R9431 Abnormal electrocardiogram [ECG] [EKG]: Secondary | ICD-10-CM

## 2018-12-24 DIAGNOSIS — Z23 Encounter for immunization: Secondary | ICD-10-CM | POA: Diagnosis not present

## 2018-12-24 DIAGNOSIS — Z0000 Encounter for general adult medical examination without abnormal findings: Secondary | ICD-10-CM

## 2018-12-24 DIAGNOSIS — E7800 Pure hypercholesterolemia, unspecified: Secondary | ICD-10-CM | POA: Diagnosis not present

## 2018-12-24 DIAGNOSIS — Z0001 Encounter for general adult medical examination with abnormal findings: Secondary | ICD-10-CM

## 2018-12-24 DIAGNOSIS — I10 Essential (primary) hypertension: Secondary | ICD-10-CM

## 2018-12-24 MED ORDER — SIMVASTATIN 40 MG PO TABS: 40.0000 mg | ORAL_TABLET | Freq: Every day | ORAL | 4 refills | Status: DC

## 2018-12-24 MED ORDER — LISINOPRIL 10 MG PO TABS: ORAL_TABLET | ORAL | 3 refills | Status: DC

## 2018-12-24 NOTE — Patient Instructions (Signed)
Health Maintenance, Female Adopting a healthy lifestyle and getting preventive care are important in promoting health and wellness. Ask your health care provider about:  The right schedule for you to have regular tests and exams.  Things you can do on your own to prevent diseases and keep yourself healthy. What should I know about diet, weight, and exercise? Eat a healthy diet   Eat a diet that includes plenty of vegetables, fruits, low-fat dairy products, and lean protein.  Do not eat a lot of foods that are high in solid fats, added sugars, or sodium. Maintain a healthy weight Body mass index (BMI) is used to identify weight problems. It estimates body fat based on height and weight. Your health care provider can help determine your BMI and help you achieve or maintain a healthy weight. Get regular exercise Get regular exercise. This is one of the most important things you can do for your health. Most adults should:  Exercise for at least 150 minutes each week. The exercise should increase your heart rate and make you sweat (moderate-intensity exercise).  Do strengthening exercises at least twice a week. This is in addition to the moderate-intensity exercise.  Spend less time sitting. Even light physical activity can be beneficial. Watch cholesterol and blood lipids Have your blood tested for lipids and cholesterol at 61 years of age, then have this test every 5 years. Have your cholesterol levels checked more often if:  Your lipid or cholesterol levels are high.  You are older than 61 years of age.  You are at high risk for heart disease. What should I know about cancer screening? Depending on your health history and family history, you may need to have cancer screening at various ages. This may include screening for:  Breast cancer.  Cervical cancer.  Colorectal cancer.  Skin cancer.  Lung cancer. What should I know about heart disease, diabetes, and high blood  pressure? Blood pressure and heart disease  High blood pressure causes heart disease and increases the risk of stroke. This is more likely to develop in people who have high blood pressure readings, are of African descent, or are overweight.  Have your blood pressure checked: ? Every 3-5 years if you are 18-39 years of age. ? Every year if you are 40 years old or older. Diabetes Have regular diabetes screenings. This checks your fasting blood sugar level. Have the screening done:  Once every three years after age 40 if you are at a normal weight and have a low risk for diabetes.  More often and at a younger age if you are overweight or have a high risk for diabetes. What should I know about preventing infection? Hepatitis B If you have a higher risk for hepatitis B, you should be screened for this virus. Talk with your health care provider to find out if you are at risk for hepatitis B infection. Hepatitis C Testing is recommended for:  Everyone born from 1945 through 1965.  Anyone with known risk factors for hepatitis C. Sexually transmitted infections (STIs)  Get screened for STIs, including gonorrhea and chlamydia, if: ? You are sexually active and are younger than 61 years of age. ? You are older than 61 years of age and your health care provider tells you that you are at risk for this type of infection. ? Your sexual activity has changed since you were last screened, and you are at increased risk for chlamydia or gonorrhea. Ask your health care provider if   you are at risk.  Ask your health care provider about whether you are at high risk for HIV. Your health care provider may recommend a prescription medicine to help prevent HIV infection. If you choose to take medicine to prevent HIV, you should first get tested for HIV. You should then be tested every 3 months for as long as you are taking the medicine. Pregnancy  If you are about to stop having your period (premenopausal) and  you may become pregnant, seek counseling before you get pregnant.  Take 400 to 800 micrograms (mcg) of folic acid every day if you become pregnant.  Ask for birth control (contraception) if you want to prevent pregnancy. Osteoporosis and menopause Osteoporosis is a disease in which the bones lose minerals and strength with aging. This can result in bone fractures. If you are 65 years old or older, or if you are at risk for osteoporosis and fractures, ask your health care provider if you should:  Be screened for bone loss.  Take a calcium or vitamin D supplement to lower your risk of fractures.  Be given hormone replacement therapy (HRT) to treat symptoms of menopause. Follow these instructions at home: Lifestyle  Do not use any products that contain nicotine or tobacco, such as cigarettes, e-cigarettes, and chewing tobacco. If you need help quitting, ask your health care provider.  Do not use street drugs.  Do not share needles.  Ask your health care provider for help if you need support or information about quitting drugs. Alcohol use  Do not drink alcohol if: ? Your health care provider tells you not to drink. ? You are pregnant, may be pregnant, or are planning to become pregnant.  If you drink alcohol: ? Limit how much you use to 0-1 drink a day. ? Limit intake if you are breastfeeding.  Be aware of how much alcohol is in your drink. In the [place] one drink equals one 12 oz bottle of beer (355 mL), one 5 oz glass of wine (148 mL), or one 1 oz glass of hard liquor (44 mL). General instructions  Schedule regular health, dental, and eye exams.  Stay current with your vaccines.  Tell your health care provider if: ? You often feel depressed. ? You have ever been abused or do not feel safe at home. Summary  Adopting a healthy lifestyle and getting preventive care are important in promoting health and wellness.  Follow your health care provider's instructions about healthy  diet, exercising, and getting tested or screened for diseases.  Follow your health care provider's instructions on monitoring your cholesterol and blood pressure. This information is not intended to replace advice given to you by your health care provider. Make sure you discuss any questions you have with your health care provider. Document Released: 09/19/2010 Document Revised: 02/27/2018 Document Reviewed: 02/27/2018 Elsevier Patient Education  2020 Else[employer] Inc[employer]  

## 2018-12-24 NOTE — Progress Notes (Signed)
Subjective:     Patient ID: Julie Wiggins, female    DOB: 1955-10-13, 63 y.o.   MRN: [id number]  Chief Complaint  Patient presents with  . Annual Exam    recheck EKG   Pt presents to the office today for CPE without pap. She states she surgery on 12/12/18 for De Quervain's tendon release. She is followed by Ortho. She reports she had an abnormal EKG during her pre-op and was told to follow up with her PCP.   She is scheduled for a colonoscopy this year.  Hypertension This is a chronic problem. The current episode started more than 1 year ago. The problem has been resolved since onset. The problem is controlled. Pertinent negatives include no headaches, malaise/fatigue, peripheral edema or shortness of breath. Risk factors for coronary artery disease include dyslipidemia and sedentary lifestyle. The current treatment provides moderate improvement. There is no history of kidney disease, CAD/MI, CVA or heart failure.  Hyperlipidemia This is a chronic problem. The current episode started more than 1 year ago. The problem is controlled. Recent lipid tests were reviewed and are normal. Pertinent negatives include no shortness of breath. Current antihyperlipidemic treatment includes statins. The current treatment provides moderate improvement of lipids. Risk factors for coronary artery disease include dyslipidemia, hypertension, a sedentary lifestyle and post-menopausal.  Osteoporosis  Pt states she takes calcium and vit daily daily. States she never started the Fosamax. Last Dexa scan was 05/02/18.   Review of Systems  Constitutional: Negative for malaise/fatigue.  Respiratory: Negative for shortness of breath.   Neurological: Negative for headaches.  All other systems reviewed and are negative.  Family History  Problem Relation Age of Onset  . Cancer Mother        ovarian  . Diabetes Father   . Colon cancer Neg Hx   . Esophageal cancer Neg Hx   . Rectal cancer Neg Hx   . Stomach cancer  Neg Hx    Social History   Socioeconomic History  . Marital status: Widowed    Spouse name: Not on file  . Number of children: Not on file  . Years of education: Not on file  . Highest education level: Not on file  Occupational History  . Not on file  Social Needs  . Financial resource strain: Not on file  . Food insecurity    Worry: Not on file    Inability: Not on file  . Transportation needs    Medical: Not on file    Non-medical: Not on file  Tobacco Use  . Smoking status: Current Every Day Smoker    Packs/day: 1.00  . Smokeless tobacco: Never Used  Substance and Sexual Activity  . Alcohol use: Yes    Comment: once yearly  . Drug use: No  . Sexual activity: Not Currently  Lifestyle  . Physical activity    Days per week: Not on file    Minutes per session: Not on file  . Stress: Not on file  Relationships  . Social Herbalist on phone: Not on file    Gets together: Not on file    Attends religious service: Not on file    Active member of club or organization: Not on file    Attends meetings of clubs or organizations: Not on file    Relationship status: Not on file  Other Topics Concern  . Not on file  Social History Narrative  . Not on file  Objective:   Physical Exam Vitals signs reviewed.  Constitutional:      General: She is not in acute distress.    Appearance: She is well-developed.  HENT:     Head: Normocephalic and atraumatic.     Right Ear: Tympanic membrane normal.     Left Ear: Tympanic membrane normal.  Eyes:     Pupils: Pupils are equal, round, and reactive to light.  Neck:     Musculoskeletal: Normal range of motion and neck supple.     Thyroid: No thyromegaly.  Cardiovascular:     Rate and Rhythm: Normal rate and regular rhythm.     Heart sounds: Normal heart sounds. No murmur.  Pulmonary:     Effort: Pulmonary effort is normal. No respiratory distress.     Breath sounds: Normal breath sounds. No wheezing.   Abdominal:     General: Bowel sounds are normal. There is no distension.     Palpations: Abdomen is soft.     Tenderness: There is no abdominal tenderness.  Musculoskeletal: Normal range of motion.        General: Tenderness (in right wrist with flexion and extension) present.  Skin:    General: Skin is warm and dry.  Neurological:     Mental Status: She is alert and oriented to person, place, and time.     Cranial Nerves: No cranial nerve deficit.     Deep Tendon Reflexes: Reflexes are normal and symmetric.  Psychiatric:        Behavior: Behavior normal.        Thought Content: Thought content normal.        Judgment: Judgment normal.        BP 129/66   Pulse 81   Temp (!) 97.1 F (36.2 C) (Temporal)   Ht _0  (1.651 m)   Wt 144 lb 6.4 oz (65.5 kg)   SpO2 99%   BMI 24.03 kg/m   Assessment & Plan:  [name] comes in today with chief complaint of Annual Exam (recheck EKG)   Diagnosis and orders addressed:  1. Encounter for preventive health examination - EKG 12-Lead - CMP14+EGFR - CBC with Differential/Platelet  2. Essential hypertension - lisinopril (ZESTRIL) 10 MG tablet; TAKE 1 TABLET BY MOUTH ONCE DAILY  Dispense: 90 tablet; Refill: 3 - Ambulatory referral to Cardiology - CMP14+EGFR - CBC with Differential/Platelet  3. Hypercholesterolemia - simvastatin (ZOCOR) 40 MG tablet; Take 1 tablet (40 mg total) by mouth at bedtime.  Dispense: 90 tablet; Refill: 4 - Ambulatory referral to Cardiology - CMP14+EGFR - CBC with Differential/Platelet - Lipid panel  4. Annual physical exam - CMP14+EGFR - CBC with Differential/Platelet - Lipid panel - TSH  5. Osteoporosis, unspecified osteoporosis type, unspecified pathological fracture presence - CMP14+EGFR - CBC with Differential/Platelet  6. Tobacco use - Ambulatory referral to Cardiology - CMP14+EGFR - CBC with Differential/Platelet  7. Abnormal EKG - Ambulatory referral to Cardiology - CMP14+EGFR -  CBC with Differential/Platelet   Labs pending Health Maintenance reviewed Diet and exercise encouraged  Follow up plan: 6 months    Evelina Dun, FNP

## 2018-12-25 LAB — LIPID PANEL
Chol/HDL Ratio: 3.2 ratio (ref 0.0–4.4)
Cholesterol, Total: 178 mg/dL (ref 100–199)
HDL: 55 mg/dL (ref >39)
LDL Chol Calc (NIH): 93 mg/dL (ref 0–99)
Triglycerides: 176 mg/dL — ABNORMAL HIGH (ref 0–149)
VLDL Cholesterol Cal: 30 mg/dL (ref 5–40)

## 2018-12-25 LAB — CMP14+EGFR
ALT: 11 IU/L (ref 0–32)
AST: 18 IU/L (ref 0–40)
Albumin/Globulin Ratio: 1.5 (ref 1.2–2.2)
Albumin: 4.1 g/dL (ref 3.8–4.9)
Alkaline Phosphatase: 102 IU/L (ref 39–117)
BUN/Creatinine Ratio: 15 (ref 12–28)
BUN: 9 mg/dL (ref 8–27)
Bilirubin Total: <0.2 mg/dL (ref 0.0–1.2)
CO2: 27 mmol/L (ref 20–29)
Calcium: 9.9 mg/dL (ref 8.7–10.3)
Chloride: 101 mmol/L (ref 96–106)
Creatinine, Ser: 0.61 mg/dL (ref 0.57–1.00)
GFR calc Af Amer: 114 mL/min/{1.73_m2} (ref >59)
GFR calc non Af Amer: 99 mL/min/{1.73_m2} (ref >59)
Globulin, Total: 2.8 g/dL (ref 1.5–4.5)
Glucose: 75 mg/dL (ref 65–99)
Potassium: 4.2 mmol/L (ref 3.5–5.2)
Sodium: 140 mmol/L (ref 134–144)
Total Protein: 6.9 g/dL (ref 6.0–8.5)

## 2018-12-25 LAB — CBC WITH DIFFERENTIAL/PLATELET
Basophils Absolute: 0.1 10*3/uL (ref 0.0–0.2)
Basos: 1 %
EOS (ABSOLUTE): 0.4 10*3/uL (ref 0.0–0.4)
Eos: 4 %
Hematocrit: 44.1 % (ref 34.0–46.6)
Hemoglobin: 15.3 g/dL (ref 11.1–15.9)
Immature Grans (Abs): 0 10*3/uL (ref 0.0–0.1)
Immature Granulocytes: 0 %
Lymphocytes Absolute: 3.4 10*3/uL — ABNORMAL HIGH (ref 0.7–3.1)
Lymphs: 34 %
MCH: 33.7 pg — ABNORMAL HIGH (ref 26.6–33.0)
MCHC: 34.7 g/dL (ref 31.5–35.7)
MCV: 97 fL (ref 79–97)
Monocytes Absolute: 0.7 10*3/uL (ref 0.1–0.9)
Monocytes: 7 %
Neutrophils Absolute: 5.4 10*3/uL (ref 1.4–7.0)
Neutrophils: 54 %
Platelets: 284 10*3/uL (ref 150–450)
RBC: 4.54 x10E6/uL (ref 3.77–5.28)
RDW: 12.4 % (ref 11.7–15.4)
WBC: 9.9 10*3/uL (ref 3.4–10.8)

## 2018-12-25 LAB — TSH: TSH: 2.85 u[IU]/mL (ref 0.450–4.500)

## 2018-12-26 ENCOUNTER — Other Ambulatory Visit: Payer: Self-pay | Admitting: Family

## 2018-12-26 MED ORDER — ATORVASTATIN CALCIUM 40 MG PO TABS: 40.0000 mg | ORAL_TABLET | Freq: Every day | ORAL | 11 refills | Status: DC

## 2019-01-22 ENCOUNTER — Telehealth: Payer: Self-pay | Admitting: Cardiology

## 2019-01-22 ENCOUNTER — Ambulatory Visit (INDEPENDENT_AMBULATORY_CARE_PROVIDER_SITE_OTHER): Payer: BC Managed Care – PPO | Admitting: Cardiology

## 2019-01-22 ENCOUNTER — Encounter: Payer: Self-pay | Admitting: Cardiology

## 2019-01-22 ENCOUNTER — Other Ambulatory Visit: Payer: Self-pay

## 2019-01-22 VITALS — BP 127/78 | HR 89 | Ht 65.0 in | Wt 145.2 lb

## 2019-01-22 DIAGNOSIS — E782 Mixed hyperlipidemia: Secondary | ICD-10-CM

## 2019-01-22 DIAGNOSIS — I10 Essential (primary) hypertension: Secondary | ICD-10-CM

## 2019-01-22 DIAGNOSIS — R9431 Abnormal electrocardiogram [ECG] [EKG]: Secondary | ICD-10-CM | POA: Diagnosis not present

## 2019-01-22 DIAGNOSIS — I493 Ventricular premature depolarization: Secondary | ICD-10-CM | POA: Diagnosis not present

## 2019-01-22 NOTE — Progress Notes (Signed)
Clinical Summary Julie Wiggins is a 63 y.o.female seen as new consult, referred by NP [facility] for abnormal EKG.   1. Abnormal EKG - noted during preop EKG, anterior Q waves. No prior history of heart disease - no recent chest pain. No SOB or DOE.  - tolerates heavy work at her job without troubles.   CAD risk factors: HTN, HL, tobacco history  2. HTN - compliant with meds  3. Hyperlipidemia - compliant with statin  4. PVCs - asymptomatic.  - high caffeine intake - normal TSH, K 4.2     SH: works at [employer]  Past Medical History:  Diagnosis Date  . Heart murmur    only heard one time  . Hyperlipidemia   . Hypertension   . Osteoporosis      No Known Allergies   Current Outpatient Medications  Medication Sig Dispense Refill  . alendronate (FOSAMAX) 70 MG tablet Take 1 tablet (70 mg total) by mouth every 7 (seven) days. Take with a full glass of water on an empty stomach. (Patient not taking: Reported on 05/27/2018) 4 tablet 11  . atorvastatin (LIPITOR) 40 MG tablet Take 1 tablet (40 mg total) by mouth daily. 30 tablet 11  . lisinopril (ZESTRIL) 10 MG tablet TAKE 1 TABLET BY MOUTH ONCE DAILY 90 tablet 3  . Na Sulfate-K Sulfate-Mg Sulf (SUPREP BOWEL PREP KIT) 17.5-3.13-1.6 GM/177ML SOLN Suprep as directed, no substitutions (Patient not taking: Reported on 12/24/2018) 354 mL 0   No current facility-administered medications for this visit.      Past Surgical History:  Procedure Laterality Date  . COLONOSCOPY    . cyst removed from right breast     1975     No Known Allergies    Family History  Problem Relation Age of Onset  . Cancer Mother        ovarian  . Diabetes Father   . Colon cancer Neg Hx   . Esophageal cancer Neg Hx   . Rectal cancer Neg Hx   . Stomach cancer Neg Hx      Social History Julie Wiggins reports that she has been smoking. She has been smoking about 1.00 pack per day. She has never used smokeless tobacco. Julie Wiggins reports  current alcohol use.   Review of Systems CONSTITUTIONAL: No weight loss, fever, chills, weakness or fatigue.  HEENT: Eyes: No visual loss, blurred vision, double vision or yellow sclerae.No hearing loss, sneezing, congestion, runny nose or sore throat.  SKIN: No rash or itching.  CARDIOVASCULAR: per hpi RESPIRATORY: No shortness of breath, cough or sputum.  GASTROINTESTINAL: No anorexia, nausea, vomiting or diarrhea. No abdominal pain or blood.  GENITOURINARY: No burning on urination, no polyuria NEUROLOGICAL: No headache, dizziness, syncope, paralysis, ataxia, numbness or tingling in the extremities. No change in bowel or bladder control.  MUSCULOSKELETAL: No muscle, back pain, joint pain or stiffness.  LYMPHATICS: No enlarged nodes. No history of splenectomy.  PSYCHIATRIC: No history of depression or anxiety.  ENDOCRINOLOGIC: No reports of sweating, cold or heat intolerance. No polyuria or polydipsia.  Marland Kitchen   Physical Examination There were no vitals filed for this visit. There were no vitals filed for this visit.  Gen: resting comfortably, no acute distress HEENT: no scleral icterus, pupils equal round and reactive, no palptable cervical adenopathy,  CV Resp: Clear to auscultation bilaterally GI: abdomen is soft, non-tender, non-distended, normal bowel sounds, no hepatosplenomegaly MSK: extremities are warm, no edema.  Skin: warm, no rash  Neuro:  no focal deficits Psych: appropriate affect     Assessment and Plan  1. Abnormal EKG - anterior Qwaves on EKG, no known prior history of CAD. Does have some risk factors - no current or prior symptoms - obtain echo to evaluate LV function and wall motion  2. HTN - at goal, continue current meds  3. Hyperlipidemia - continue statin  4. PVCs - noted on EKG today, asymptomatic - f/u echo - discussed weaning caffeine.       Arnoldo Lenis, M.D

## 2019-01-22 NOTE — Patient Instructions (Signed)
Your physician recommends that you schedule a follow-up appointment in: PENDING TEST RESULTS WITH DR [facility]   Your physician has requested that you have an echocardiogram. Echocardiography is a painless test that uses sound waves to create images of your heart. It provides your doctor with information about the size and shape of your heart and how well your heart's chambers and valves are working. This procedure takes approximately one hour. There are no restrictions for this procedure.  Your physician recommends that you continue on your current medications as directed. Please refer to the Current Medication list given to you today.  Thank you for choosing [facility]!!

## 2019-01-22 NOTE — Addendum Note (Signed)
Addended by: Julian Hy T on: 01/22/2019 09:52 AM   Modules accepted: Orders

## 2019-01-22 NOTE — Telephone Encounter (Signed)
Pre-cert Verification for the following procedure   Echo scheduled for 01-29-2019 at [facility].

## 2019-01-29 ENCOUNTER — Other Ambulatory Visit: Payer: Self-pay

## 2019-01-29 ENCOUNTER — Ambulatory Visit (HOSPITAL_COMMUNITY)
Admission: RE | Admit: 2019-01-29 | Discharge: 2019-01-29 | Disposition: A | Discharge status: Home / Self Care | Payer: BC Managed Care – PPO | Source: Ambulatory Visit | Attending: Cardiology | Admitting: Cardiology

## 2019-01-29 DIAGNOSIS — R9431 Abnormal electrocardiogram [ECG] [EKG]: Secondary | ICD-10-CM | POA: Insufficient documentation

## 2019-01-29 NOTE — Progress Notes (Signed)
*  PRELIMINARY RESULTS* Echocardiogram 2D Echocardiogram has been performed.  Julie Wiggins 01/29/2019, 9:09 AM

## 2019-02-04 ENCOUNTER — Telehealth: Payer: Self-pay | Admitting: *Deleted

## 2019-02-04 NOTE — Telephone Encounter (Signed)
LM to return call.

## 2019-02-04 NOTE — Telephone Encounter (Signed)
-----   Message from Drema Dallas, [place] sent at 02/04/2019  9:53 AM EST ----- Julie Wiggins pt. ----- Message ----- From: Arnoldo Lenis, MD Sent: 02/04/2019   8:43 AM EST To: Drema Dallas, CMA  Echo overall looks good. Heart pumping function is normal with normal wall motion, no signs of prior damage to the heart, I think her EKG is a false finding. She does have a heart valve that is a little bit leaky, her aortic valve. Just something for [place] to watch over time, not of concern at this time. F/u 6 months   Carlyle Dolly MD

## 2019-02-04 NOTE — Telephone Encounter (Signed)
Patient returned call -requesting results.  Patient works until 300pm daily.

## 2019-02-04 NOTE — Telephone Encounter (Signed)
Patient notified and verbalized understanding.  Copy to pmd.

## 2019-03-17 ENCOUNTER — Encounter: Payer: BC Managed Care – PPO | Admitting: Gastroenterology

## 2019-03-26 DIAGNOSIS — M19031 Primary osteoarthritis, right wrist: Secondary | ICD-10-CM | POA: Diagnosis not present

## 2019-03-26 DIAGNOSIS — M19041 Primary osteoarthritis, right hand: Secondary | ICD-10-CM | POA: Diagnosis not present

## 2019-03-27 DIAGNOSIS — H40033 Anatomical narrow angle, bilateral: Secondary | ICD-10-CM | POA: Diagnosis not present

## 2019-03-27 DIAGNOSIS — H2513 Age-related nuclear cataract, bilateral: Secondary | ICD-10-CM | POA: Diagnosis not present

## 2019-03-27 DIAGNOSIS — H5213 Myopia, bilateral: Secondary | ICD-10-CM | POA: Diagnosis not present

## 2019-04-28 DIAGNOSIS — X32XXXA Exposure to sunlight, initial encounter: Secondary | ICD-10-CM | POA: Diagnosis not present

## 2019-04-28 DIAGNOSIS — D2222 Melanocytic nevi of left ear and external auricular canal: Secondary | ICD-10-CM | POA: Diagnosis not present

## 2019-04-28 DIAGNOSIS — Z08 Encounter for follow-up examination after completed treatment for malignant neoplasm: Secondary | ICD-10-CM | POA: Diagnosis not present

## 2019-04-28 DIAGNOSIS — L570 Actinic keratosis: Secondary | ICD-10-CM | POA: Diagnosis not present

## 2019-04-28 DIAGNOSIS — Z85828 Personal history of other malignant neoplasm of skin: Secondary | ICD-10-CM | POA: Diagnosis not present

## 2019-04-28 DIAGNOSIS — D485 Neoplasm of uncertain behavior of skin: Secondary | ICD-10-CM | POA: Diagnosis not present

## 2019-04-28 DIAGNOSIS — D224 Melanocytic nevi of scalp and neck: Secondary | ICD-10-CM | POA: Diagnosis not present

## 2019-05-05 ENCOUNTER — Encounter: Payer: Self-pay | Admitting: Gastroenterology

## 2019-05-13 ENCOUNTER — Ambulatory Visit (AMBULATORY_SURGERY_CENTER): Payer: Self-pay | Admitting: *Deleted

## 2019-05-13 ENCOUNTER — Other Ambulatory Visit: Payer: Self-pay

## 2019-05-13 VITALS — Temp 97.1°F | Ht 65.0 in | Wt 145.0 lb

## 2019-05-13 DIAGNOSIS — Z01818 Encounter for other preprocedural examination: Secondary | ICD-10-CM

## 2019-05-13 DIAGNOSIS — Z1211 Encounter for screening for malignant neoplasm of colon: Secondary | ICD-10-CM

## 2019-05-13 NOTE — Progress Notes (Signed)
Patient is here in-person for PV. Patient denies any allergies to eggs or soy. Patient denies any problems with anesthesia/sedation. Patient denies any oxygen use at home. Patient denies taking any diet/weight loss medications or blood thinners. Patient is not being treated for MRSA or C-diff.  COVID-19 screening test is on 3/3, in [place] the pt is aware. Pt is aware that care partner will wait in the car during procedure; if they feel like they will be too hot or cold to wait in the car; they may wait in the 4 th floor lobby. Patient is aware to bring only one care partner. We want them to wear a mask (we do not have any that we can provide them), practice social distancing, and we will check their temperatures when they get here.  I did remind the patient that their care partner needs to stay in the parking lot the entire time and have a cell phone available, we will call them when the pt is ready for discharge. Patient will wear mask into building.    Pt has the Suprep kit at home.

## 2019-05-21 ENCOUNTER — Other Ambulatory Visit: Payer: Self-pay

## 2019-05-21 ENCOUNTER — Other Ambulatory Visit (HOSPITAL_COMMUNITY)
Admission: RE | Admit: 2019-05-21 | Discharge: 2019-05-21 | Disposition: A | Discharge status: Home / Self Care | Payer: BC Managed Care – PPO | Source: Ambulatory Visit | Attending: Gastroenterology | Admitting: Gastroenterology

## 2019-05-21 ENCOUNTER — Ambulatory Visit (INDEPENDENT_AMBULATORY_CARE_PROVIDER_SITE_OTHER): Payer: BC Managed Care – PPO

## 2019-05-21 DIAGNOSIS — Z20822 Contact with and (suspected) exposure to covid-19: Secondary | ICD-10-CM | POA: Diagnosis not present

## 2019-05-21 DIAGNOSIS — Z01812 Encounter for preprocedural laboratory examination: Secondary | ICD-10-CM | POA: Insufficient documentation

## 2019-05-21 DIAGNOSIS — Z1159 Encounter for screening for other viral diseases: Secondary | ICD-10-CM

## 2019-05-21 LAB — SARS CORONAVIRUS 2 (TAT 6-24 HRS): SARS Coronavirus 2: NEGATIVE

## 2019-05-22 ENCOUNTER — Encounter: Payer: Self-pay | Admitting: Gastroenterology

## 2019-05-26 ENCOUNTER — Encounter: Payer: Self-pay | Admitting: Gastroenterology

## 2019-05-26 ENCOUNTER — Other Ambulatory Visit: Payer: Self-pay

## 2019-05-26 ENCOUNTER — Ambulatory Visit (AMBULATORY_SURGERY_CENTER): Payer: BC Managed Care – PPO | Admitting: Gastroenterology

## 2019-05-26 VITALS — BP 128/48 | HR 91 | Temp 97.3°F | Resp 25 | Ht 65.0 in | Wt 145.0 lb

## 2019-05-26 DIAGNOSIS — D120 Benign neoplasm of cecum: Secondary | ICD-10-CM | POA: Diagnosis not present

## 2019-05-26 DIAGNOSIS — Z1211 Encounter for screening for malignant neoplasm of colon: Secondary | ICD-10-CM | POA: Diagnosis not present

## 2019-05-26 MED ORDER — SODIUM CHLORIDE 0.9 % IV SOLN: 500.0000 mL | Freq: Once | INTRAVENOUS | Status: DC

## 2019-05-26 NOTE — Progress Notes (Signed)
Report given to PACU, vss 

## 2019-05-26 NOTE — Progress Notes (Signed)
Pt's states no medical or surgical changes since previsit or office visit. 

## 2019-05-26 NOTE — Patient Instructions (Signed)
Discharge instructions given. ?Handouts on polyps and Hemorrhoids. ?Resume previous medications. ?YOU HAD AN ENDOSCOPIC PROCEDURE TODAY AT THE [facility]:   Refer to the procedure report that was given to you for any specific questions about what was found during the examination.  If the procedure report does not answer your questions, please call your gastroenterologist to clarify.  If you requested that your care partner not be given the details of your procedure findings, then the procedure report has been included in a sealed envelope for you to review at your convenience later. ? ?YOU SHOULD EXPECT: Some feelings of bloating in the abdomen. Passage of more gas than usual.  Walking can help get rid of the air that was put into your GI tract during the procedure and reduce the bloating. If you had a lower endoscopy (such as a colonoscopy or flexible sigmoidoscopy) you may notice spotting of blood in your stool or on the toilet paper. If you underwent a bowel prep for your procedure, you may not have a normal bowel movement for a few days. ? ?Please Note:  You might notice some irritation and congestion in your nose or some drainage.  This is from the oxygen used during your procedure.  There is no need for concern and it should clear up in a day or so. ? ?SYMPTOMS TO REPORT IMMEDIATELY: ? ?Following lower endoscopy (colonoscopy or flexible sigmoidoscopy): ? Excessive amounts of blood in the stool ? Significant tenderness or worsening of abdominal pains ? Swelling of the abdomen that is new, acute ? Fever of 100?F or higher ? ? ?For urgent or emergent issues, a gastroenterologist can be reached at any hour by calling [phone]. ?Do not use MyChart messaging for urgent concerns.  ? ? ?DIET:  We do recommend a small meal at first, but then you may proceed to your regular diet.  Drink plenty of fluids but you should avoid alcoholic beverages for 24 hours. ? ?ACTIVITY:  You should plan to take it  easy for the rest of today and you should NOT DRIVE or use heavy machinery until tomorrow (because of the sedation medicines used during the test).   ? ?FOLLOW UP: ?Our staff will call the number listed on your records 48-72 hours following your procedure to check on you and address any questions or concerns that you may have regarding the information given to you following your procedure. If we do not reach you, we will leave a message.  We will attempt to reach you two times.  During this call, we will ask if you have developed any symptoms of COVID 19. If you develop any symptoms (ie: fever, flu-like symptoms, shortness of breath, cough etc.) before then, please call [phone].  If you test positive for Covid 19 in the 2 weeks post procedure, please call and report this information to us.   ? ?If any biopsies were taken you will be contacted by phone or by letter within the next 1-3 weeks.  Please call us at [phone] if you have not heard about the biopsies in 3 weeks.  ? ? ?SIGNATURES/CONFIDENTIALITY: ?You and/or your care partner have signed paperwork which will be entered into your electronic medical record.  These signatures attest to the fact that that the information above on your After Visit Summary has been reviewed and is understood.  Full responsibility of the confidentiality of this discharge information lies with you and/or your care-partner.  ?

## 2019-05-26 NOTE — Progress Notes (Signed)
Called to room to assist during endoscopic procedure.  Patient ID and intended procedure confirmed with present staff. Received instructions for my participation in the procedure from the performing physician.  

## 2019-05-26 NOTE — Op Note (Signed)
[facility] Patient Name: Julie Wiggins Procedure Date: 05/26/2019 11:42 AM MRN: [id number] Endoscopist: Remo Lipps P. Havery Moros , MD Age: 64 Referring MD:  Date of Birth: [birth year] Gender: Female Account #: [id number] Procedure:                Colonoscopy Indications:              Screening for colorectal malignant neoplasm Medicines:                Monitored Anesthesia Care Procedure:                Pre-Anesthesia Assessment:                           - Prior to the procedure, a History and Physical                            was performed, and patient medications and                            allergies were reviewed. The patient's tolerance of                            previous anesthesia was also reviewed. The risks                            and benefits of the procedure and the sedation                            options and risks were discussed with the patient.                            All questions were answered, and informed consent                            was obtained. Prior Anticoagulants: The patient has                            taken no previous anticoagulant or antiplatelet                            agents. ASA Grade Assessment: II - A patient with                            mild systemic disease. After reviewing the risks                            and benefits, the patient was deemed in                            satisfactory condition to undergo the procedure.                           After obtaining informed consent, the colonoscope  was passed under direct vision. Throughout the                            procedure, the patient's blood pressure, pulse, and                            oxygen saturations were monitored continuously. The                            Colonoscope was introduced through the anus and                            advanced to the the cecum, identified by                            appendiceal orifice and  ileocecal valve. The                            colonoscopy was performed without difficulty. The                            patient tolerated the procedure well. The quality                            of the bowel preparation was adequate. The                            ileocecal valve, appendiceal orifice, and rectum                            were photographed. Scope In: 11:45:01 AM Scope Out: 12:05:47 PM Scope Withdrawal Time: 0 hours 18 minutes 17 seconds  Total Procedure Duration: 0 hours 20 minutes 46 seconds  Findings:                 The perianal and digital rectal examinations were                            normal.                           Two sessile polyps were found in the ileocecal                            valve. The polyps were 2 to 3 mm in size. These                            polyps were removed with a cold snare. Resection                            and retrieval were complete.                           Internal hemorrhoids were found. The hemorrhoids  were small.                           The exam was otherwise without abnormality. Complications:            No immediate complications. Estimated blood loss:                            Minimal. Estimated Blood Loss:     Estimated blood loss was minimal. Impression:               - Two 2 to 3 mm polyps at the ileocecal valve,                            removed with a cold snare. Resected and retrieved.                           - Internal hemorrhoids.                           - The examination was otherwise normal. Recommendation:           - Patient has a contact number available for                            emergencies. The signs and symptoms of potential                            delayed complications were discussed with the                            patient. Return to normal activities tomorrow.                            Written discharge instructions were provided to the                             patient.                           - Resume previous diet.                           - Continue present medications.                           - Await pathology results. Remo Lipps P. [place]ont Gavitt, MD 05/26/2019 12:08:47 PM This report has been signed electronically.

## 2019-05-28 ENCOUNTER — Telehealth: Payer: Self-pay | Admitting: *Deleted

## 2019-05-28 NOTE — Telephone Encounter (Signed)
Second follow up call  attempt.  Message left this morning. 

## 2019-05-28 NOTE — Telephone Encounter (Signed)
First follow up call attempt.  Message left on vm. 

## 2019-07-14 DIAGNOSIS — Z1231 Encounter for screening mammogram for malignant neoplasm of breast: Secondary | ICD-10-CM | POA: Diagnosis not present

## 2019-10-13 DIAGNOSIS — D225 Melanocytic nevi of trunk: Secondary | ICD-10-CM | POA: Diagnosis not present

## 2019-10-13 DIAGNOSIS — Z1283 Encounter for screening for malignant neoplasm of skin: Secondary | ICD-10-CM | POA: Diagnosis not present

## 2019-11-12 DIAGNOSIS — H10013 Acute follicular conjunctivitis, bilateral: Secondary | ICD-10-CM | POA: Diagnosis not present

## 2020-01-06 ENCOUNTER — Other Ambulatory Visit: Payer: Self-pay | Admitting: Family

## 2020-01-16 ENCOUNTER — Ambulatory Visit: Payer: BC Managed Care – PPO | Admitting: Family

## 2020-02-10 ENCOUNTER — Other Ambulatory Visit: Payer: Self-pay | Admitting: Family

## 2020-02-17 ENCOUNTER — Other Ambulatory Visit: Payer: Self-pay

## 2020-02-17 ENCOUNTER — Other Ambulatory Visit (HOSPITAL_COMMUNITY)
Admission: RE | Admit: 2020-02-17 | Discharge: 2020-02-17 | Disposition: A | Discharge status: Home / Self Care | Payer: BC Managed Care – PPO | Source: Ambulatory Visit | Attending: Family | Admitting: Family

## 2020-02-17 ENCOUNTER — Encounter: Payer: Self-pay | Admitting: Family

## 2020-02-17 ENCOUNTER — Ambulatory Visit (INDEPENDENT_AMBULATORY_CARE_PROVIDER_SITE_OTHER): Payer: BC Managed Care – PPO | Admitting: Family

## 2020-02-17 VITALS — BP 125/64 | HR 65 | Temp 97.4°F | Ht 65.0 in | Wt 142.0 lb

## 2020-02-17 DIAGNOSIS — I10 Essential (primary) hypertension: Secondary | ICD-10-CM

## 2020-02-17 DIAGNOSIS — Z01419 Encounter for gynecological examination (general) (routine) without abnormal findings: Secondary | ICD-10-CM | POA: Diagnosis not present

## 2020-02-17 DIAGNOSIS — E7800 Pure hypercholesterolemia, unspecified: Secondary | ICD-10-CM | POA: Diagnosis not present

## 2020-02-17 DIAGNOSIS — Z0000 Encounter for general adult medical examination without abnormal findings: Secondary | ICD-10-CM | POA: Insufficient documentation

## 2020-02-17 DIAGNOSIS — Z0001 Encounter for general adult medical examination with abnormal findings: Secondary | ICD-10-CM | POA: Diagnosis not present

## 2020-02-17 DIAGNOSIS — Z01411 Encounter for gynecological examination (general) (routine) with abnormal findings: Secondary | ICD-10-CM

## 2020-02-17 DIAGNOSIS — Z720 Tobacco use: Secondary | ICD-10-CM

## 2020-02-17 MED ORDER — LISINOPRIL 10 MG PO TABS: ORAL_TABLET | ORAL | 3 refills | Status: DC

## 2020-02-17 MED ORDER — ATORVASTATIN CALCIUM 40 MG PO TABS: 40.0000 mg | ORAL_TABLET | Freq: Every day | ORAL | 3 refills | Status: DC

## 2020-02-17 NOTE — Patient Instructions (Signed)
Health Maintenance, Female Adopting a healthy lifestyle and getting preventive care are important in promoting health and wellness. Ask your health care provider about:  The right schedule for you to have regular tests and exams.  Things you can do on your own to prevent diseases and keep yourself healthy. What should I know about diet, weight, and exercise? Eat a healthy diet   Eat a diet that includes plenty of vegetables, fruits, low-fat dairy products, and lean protein.  Do not eat a lot of foods that are high in solid fats, added sugars, or sodium. Maintain a healthy weight Body mass index (BMI) is used to identify weight problems. It estimates body fat based on height and weight. Your health care provider can help determine your BMI and help you achieve or maintain a healthy weight. Get regular exercise Get regular exercise. This is one of the most important things you can do for your health. Most adults should:  Exercise for at least 150 minutes each week. The exercise should increase your heart rate and make you sweat (moderate-intensity exercise).  Do strengthening exercises at least twice a week. This is in addition to the moderate-intensity exercise.  Spend less time sitting. Even light physical activity can be beneficial. Watch cholesterol and blood lipids Have your blood tested for lipids and cholesterol at 62 years of age, then have this test every 5 years. Have your cholesterol levels checked more often if:  Your lipid or cholesterol levels are high.  You are older than 62 years of age.  You are at high risk for heart disease. What should I know about cancer screening? Depending on your health history and family history, you may need to have cancer screening at various ages. This may include screening for:  Breast cancer.  Cervical cancer.  Colorectal cancer.  Skin cancer.  Lung cancer. What should I know about heart disease, diabetes, and high blood  pressure? Blood pressure and heart disease  High blood pressure causes heart disease and increases the risk of stroke. This is more likely to develop in people who have high blood pressure readings, are of African descent, or are overweight.  Have your blood pressure checked: ? Every 3-5 years if you are 18-39 years of age. ? Every year if you are 40 years old or older. Diabetes Have regular diabetes screenings. This checks your fasting blood sugar level. Have the screening done:  Once every three years after age 40 if you are at a normal weight and have a low risk for diabetes.  More often and at a younger age if you are overweight or have a high risk for diabetes. What should I know about preventing infection? Hepatitis B If you have a higher risk for hepatitis B, you should be screened for this virus. Talk with your health care provider to find out if you are at risk for hepatitis B infection. Hepatitis C Testing is recommended for:  Everyone born from 1945 through 1965.  Anyone with known risk factors for hepatitis C. Sexually transmitted infections (STIs)  Get screened for STIs, including gonorrhea and chlamydia, if: ? You are sexually active and are younger than 62 years of age. ? You are older than 62 years of age and your health care provider tells you that you are at risk for this type of infection. ? Your sexual activity has changed since you were last screened, and you are at increased risk for chlamydia or gonorrhea. Ask your health care provider if   you are at risk.  Ask your health care provider about whether you are at high risk for HIV. Your health care provider may recommend a prescription medicine to help prevent HIV infection. If you choose to take medicine to prevent HIV, you should first get tested for HIV. You should then be tested every 3 months for as long as you are taking the medicine. Pregnancy  If you are about to stop having your period (premenopausal) and  you may become pregnant, seek counseling before you get pregnant.  Take 400 to 800 micrograms (mcg) of folic acid every day if you become pregnant.  Ask for birth control (contraception) if you want to prevent pregnancy. Osteoporosis and menopause Osteoporosis is a disease in which the bones lose minerals and strength with aging. This can result in bone fractures. If you are 65 years old or older, or if you are at risk for osteoporosis and fractures, ask your health care provider if you should:  Be screened for bone loss.  Take a calcium or vitamin D supplement to lower your risk of fractures.  Be given hormone replacement therapy (HRT) to treat symptoms of menopause. Follow these instructions at home: Lifestyle  Do not use any products that contain nicotine or tobacco, such as cigarettes, e-cigarettes, and chewing tobacco. If you need help quitting, ask your health care provider.  Do not use street drugs.  Do not share needles.  Ask your health care provider for help if you need support or information about quitting drugs. Alcohol use  Do not drink alcohol if: ? Your health care provider tells you not to drink. ? You are pregnant, may be pregnant, or are planning to become pregnant.  If you drink alcohol: ? Limit how much you use to 0-1 drink a day. ? Limit intake if you are breastfeeding.  Be aware of how much alcohol is in your drink. In the [place] one drink equals one 12 oz bottle of beer (355 mL), one 5 oz glass of wine (148 mL), or one 1 oz glass of hard liquor (44 mL). General instructions  Schedule regular health, dental, and eye exams.  Stay current with your vaccines.  Tell your health care provider if: ? You often feel depressed. ? You have ever been abused or do not feel safe at home. Summary  Adopting a healthy lifestyle and getting preventive care are important in promoting health and wellness.  Follow your health care provider's instructions about healthy  diet, exercising, and getting tested or screened for diseases.  Follow your health care provider's instructions on monitoring your cholesterol and blood pressure. This information is not intended to replace advice given to you by your health care provider. Make sure you discuss any questions you have with your health care provider. Document Revised: 02/27/2018 Document Reviewed: 02/27/2018 Elsevier Patient Education  2020 [employer]  

## 2020-02-17 NOTE — Progress Notes (Signed)
 Subjective:    Patient ID: Julie Wiggins, female    DOB: 06/30/1955, 64 y.o.   MRN: [id number]  Chief Complaint  Patient presents with  . Annual Exam    with pap   PT presents to the office today for CPE with pap.  Hypertension This is a chronic problem. The current episode started more than 1 year ago. The problem has been resolved since onset. The problem is controlled. Pertinent negatives include no malaise/fatigue, peripheral edema or shortness of breath. Risk factors for coronary artery disease include dyslipidemia, sedentary lifestyle and smoking/tobacco exposure. The current treatment provides moderate improvement. There is no history of CVA or heart failure.  Hyperlipidemia This is a chronic problem. The current episode started more than 1 year ago. The problem is controlled. Recent lipid tests were reviewed and are normal. Pertinent negatives include no shortness of breath. Current antihyperlipidemic treatment includes statins. The current treatment provides moderate improvement of lipids. Risk factors for coronary artery disease include dyslipidemia, hypertension and a sedentary lifestyle.  Nicotine Dependence Presents for follow-up visit. Her urge triggers include company of smokers. The symptoms have been stable. She smokes 1 pack of cigarettes per day.  Gynecologic Exam The patient's pertinent negatives include no genital lesions, vaginal bleeding or vaginal discharge.      Review of Systems  Constitutional: Negative for malaise/fatigue.  Respiratory: Negative for shortness of breath.   Genitourinary: Negative for vaginal discharge.  All other systems reviewed and are negative.  Family History  Problem Relation Age of Onset  . Cancer Mother        ovarian  . Diabetes Father   . Colon cancer Neg Hx   . Esophageal cancer Neg Hx   . Rectal cancer Neg Hx   . Stomach cancer Neg Hx   . Colon polyps Neg Hx    Social History   Socioeconomic History  . Marital status:  Widowed    Spouse name: Not on file  . Number of children: Not on file  . Years of education: Not on file  . Highest education level: Not on file  Occupational History  . Not on file  Tobacco Use  . Smoking status: Current Every Day Smoker    Packs/day: 1.00    Types: Cigarettes    Start date: 01/22/1976  . Smokeless tobacco: Never Used  Vaping Use  . Vaping Use: Never used  Substance and Sexual Activity  . Alcohol use: Not Currently    Comment: once yearly  . Drug use: No  . Sexual activity: Not Currently  Other Topics Concern  . Not on file  Social History Narrative  . Not on file   Social Determinants of Health   Financial Resource Strain:   . Difficulty of Paying Living Expenses: Not on file  Food Insecurity:   . Worried About Running Out of Food in the Last Year: Not on file  . Ran Out of Food in the Last Year: Not on file  Transportation Needs:   . Lack of Transportation (Medical): Not on file  . Lack of Transportation (Non-Medical): Not on file  Physical Activity:   . Days of Exercise per Week: Not on file  . Minutes of Exercise per Session: Not on file  Stress:   . Feeling of Stress : Not on file  Social Connections:   . Frequency of Communication with Friends and Family: Not on file  . Frequency of Social Gatherings with Friends and Family: Not on file  .   Attends Religious Services: Not on file  . Active Member of Clubs or Organizations: Not on file  . Attends Archivist Meetings: Not on file  . Marital Status: Not on file       Objective:   Physical Exam Vitals reviewed.  Constitutional:      General: She is not in acute distress.    Appearance: She is well-developed.  HENT:     Head: Normocephalic and atraumatic.     Right Ear: Tympanic membrane normal.     Left Ear: Tympanic membrane normal.  Eyes:     Pupils: Pupils are equal, round, and reactive to light.  Neck:     Thyroid: No thyromegaly.  Cardiovascular:     Rate and Rhythm:  Normal rate and regular rhythm.     Heart sounds: Normal heart sounds. No murmur heard.   Pulmonary:     Effort: Pulmonary effort is normal. No respiratory distress.     Breath sounds: Normal breath sounds. No wheezing.  Chest:     Breasts:        Right: No swelling, bleeding, inverted nipple, mass, nipple discharge, skin change or tenderness.        Left: No swelling, bleeding, inverted nipple, mass, nipple discharge, skin change or tenderness.  Abdominal:     General: Bowel sounds are normal. There is no distension.     Palpations: Abdomen is soft.     Tenderness: There is no abdominal tenderness.  Genitourinary:    Comments: Bimanual exam- no adnexal masses or tenderness, ovaries nonpalpable   Cervix parous and pink- No discharge  Musculoskeletal:        General: No tenderness. Normal range of motion.     Cervical back: Normal range of motion and neck supple.  Skin:    General: Skin is warm and dry.  Neurological:     Mental Status: She is alert and oriented to person, place, and time.     Cranial Nerves: No cranial nerve deficit.     Deep Tendon Reflexes: Reflexes are normal and symmetric.  Psychiatric:        Behavior: Behavior normal.        Thought Content: Thought content normal.        Judgment: Judgment normal.       BP 125/64   Pulse 65   Temp (!) 97.4 F (36.3 C) (Temporal)   Ht 5' 5" (1.651 m)   Wt 142 lb (64.4 kg)   SpO2 99%   BMI 23.63 kg/m      Assessment & Plan:  Julie Wiggins comes in today with chief complaint of Annual Exam (with pap)   Diagnosis and orders addressed:  1. Essential hypertension - lisinopril (ZESTRIL) 10 MG tablet; TAKE 1 TABLET BY MOUTH ONCE DAILY  Dispense: 90 tablet; Refill: 3 - CMP14+EGFR - CBC with Differential/Platelet  2. Annual physical exam - CMP14+EGFR - CBC with Differential/Platelet - Lipid panel - TSH - Cytology - PAP([place])  3. Encounter for gynecological examination without abnormal finding -  CMP14+EGFR - CBC with Differential/Platelet - Cytology - PAP([place])  4. Primary hypertension - CMP14+EGFR - CBC with Differential/Platelet  5. Hypercholesterolemia - CMP14+EGFR - CBC with Differential/Platelet - Lipid panel  6. Tobacco [place] - CMP14+EGFR - CBC with Differential/Platelet   Labs pending Health Maintenance reviewed Diet and exercise encouraged  Follow up plan: 1 year    Evelina Dun, FNP

## 2020-02-18 ENCOUNTER — Other Ambulatory Visit: Payer: BC Managed Care – PPO

## 2020-02-18 DIAGNOSIS — Z0000 Encounter for general adult medical examination without abnormal findings: Secondary | ICD-10-CM | POA: Diagnosis not present

## 2020-02-18 DIAGNOSIS — Z01419 Encounter for gynecological examination (general) (routine) without abnormal findings: Secondary | ICD-10-CM | POA: Diagnosis not present

## 2020-02-18 DIAGNOSIS — E7800 Pure hypercholesterolemia, unspecified: Secondary | ICD-10-CM | POA: Diagnosis not present

## 2020-02-18 DIAGNOSIS — I10 Essential (primary) hypertension: Secondary | ICD-10-CM | POA: Diagnosis not present

## 2020-02-19 LAB — CMP14+EGFR
ALT: 9 IU/L (ref 0–32)
AST: 15 IU/L (ref 0–40)
Albumin/Globulin Ratio: 1.7 (ref 1.2–2.2)
Albumin: 4.2 g/dL (ref 3.8–4.8)
Alkaline Phosphatase: 105 IU/L (ref 44–121)
BUN/Creatinine Ratio: 16 (ref 12–28)
BUN: 10 mg/dL (ref 8–27)
Bilirubin Total: 0.3 mg/dL (ref 0.0–1.2)
CO2: 23 mmol/L (ref 20–29)
Calcium: 9.5 mg/dL (ref 8.7–10.3)
Chloride: 101 mmol/L (ref 96–106)
Creatinine, Ser: 0.62 mg/dL (ref 0.57–1.00)
GFR calc Af Amer: 112 mL/min/{1.73_m2} (ref >59)
GFR calc non Af Amer: 97 mL/min/{1.73_m2} (ref >59)
Globulin, Total: 2.5 g/dL (ref 1.5–4.5)
Glucose: 82 mg/dL (ref 65–99)
Potassium: 4.2 mmol/L (ref 3.5–5.2)
Sodium: 139 mmol/L (ref 134–144)
Total Protein: 6.7 g/dL (ref 6.0–8.5)

## 2020-02-19 LAB — CBC WITH DIFFERENTIAL/PLATELET
Basophils Absolute: 0.1 10*3/uL (ref 0.0–0.2)
Basos: 1 %
EOS (ABSOLUTE): 0.3 10*3/uL (ref 0.0–0.4)
Eos: 3 %
Hematocrit: 43.4 % (ref 34.0–46.6)
Hemoglobin: 14.7 g/dL (ref 11.1–15.9)
Immature Grans (Abs): 0 10*3/uL (ref 0.0–0.1)
Immature Granulocytes: 0 %
Lymphocytes Absolute: 3.4 10*3/uL — ABNORMAL HIGH (ref 0.7–3.1)
Lymphs: 33 %
MCH: 33.1 pg — ABNORMAL HIGH (ref 26.6–33.0)
MCHC: 33.9 g/dL (ref 31.5–35.7)
MCV: 98 fL — ABNORMAL HIGH (ref 79–97)
Monocytes Absolute: 0.6 10*3/uL (ref 0.1–0.9)
Monocytes: 5 %
Neutrophils Absolute: 5.9 10*3/uL (ref 1.4–7.0)
Neutrophils: 58 %
Platelets: 272 10*3/uL (ref 150–450)
RBC: 4.44 x10E6/uL (ref 3.77–5.28)
RDW: 11.5 % — ABNORMAL LOW (ref 11.7–15.4)
WBC: 10.3 10*3/uL (ref 3.4–10.8)

## 2020-02-19 LAB — LIPID PANEL
Chol/HDL Ratio: 2.3 ratio (ref 0.0–4.4)
Cholesterol, Total: 144 mg/dL (ref 100–199)
HDL: 63 mg/dL (ref >39)
LDL Chol Calc (NIH): 67 mg/dL (ref 0–99)
Triglycerides: 70 mg/dL (ref 0–149)
VLDL Cholesterol Cal: 14 mg/dL (ref 5–40)

## 2020-02-19 LAB — TSH: TSH: 1.98 u[IU]/mL (ref 0.450–4.500)

## 2020-02-20 LAB — CYTOLOGY - PAP: Diagnosis: NEGATIVE

## 2020-04-17 ENCOUNTER — Other Ambulatory Visit: Payer: Self-pay | Admitting: Family

## 2020-08-23 ENCOUNTER — Encounter: Payer: Self-pay | Admitting: Family

## 2020-08-23 ENCOUNTER — Other Ambulatory Visit: Payer: Self-pay

## 2020-08-23 ENCOUNTER — Ambulatory Visit: Payer: BC Managed Care – PPO | Admitting: Family

## 2020-08-23 VITALS — BP 127/58 | HR 66 | Temp 97.9°F | Ht 65.0 in | Wt 136.0 lb

## 2020-08-23 DIAGNOSIS — E7800 Pure hypercholesterolemia, unspecified: Secondary | ICD-10-CM

## 2020-08-23 DIAGNOSIS — Z720 Tobacco use: Secondary | ICD-10-CM

## 2020-08-23 DIAGNOSIS — I10 Essential (primary) hypertension: Secondary | ICD-10-CM | POA: Diagnosis not present

## 2020-08-23 DIAGNOSIS — Z0001 Encounter for general adult medical examination with abnormal findings: Secondary | ICD-10-CM | POA: Diagnosis not present

## 2020-08-23 DIAGNOSIS — M810 Age-related osteoporosis without current pathological fracture: Secondary | ICD-10-CM

## 2020-08-23 DIAGNOSIS — Z0000 Encounter for general adult medical examination without abnormal findings: Secondary | ICD-10-CM

## 2020-08-23 DIAGNOSIS — M25562 Pain in left knee: Secondary | ICD-10-CM

## 2020-08-23 MED ORDER — LISINOPRIL 10 MG PO TABS: ORAL_TABLET | ORAL | 3 refills | Status: DC

## 2020-08-23 MED ORDER — DICLOFENAC SODIUM 75 MG PO TBEC: 75.0000 mg | DELAYED_RELEASE_TABLET | Freq: Two times a day (BID) | ORAL | 2 refills | Status: DC

## 2020-08-23 MED ORDER — ATORVASTATIN CALCIUM 40 MG PO TABS: 1.0000 | ORAL_TABLET | Freq: Every day | ORAL | 3 refills | Status: DC

## 2020-08-23 NOTE — Progress Notes (Signed)
Subjective:    Patient ID: Julie Wiggins, female    DOB: 23-Aug-1955, 65 y.o.   MRN: [id number]  Chief Complaint  Patient presents with  . Medical Management of Chronic Issues  . Knee Pain    Left knee pain lump and tender    Pt presents to the office today for CPE without pap. Knee Pain  The incident occurred more than 1 week ago. Injury mechanism: repeatitive motion. The pain is present in the left knee. The quality of the pain is described as aching. The pain is at a severity of 5/10. The pain is moderate. The pain has been intermittent since onset. She has tried NSAIDs and rest for the symptoms. The treatment provided mild relief.  Hypertension This is a chronic problem. The current episode started more than 1 year ago. The problem has been resolved since onset. The problem is controlled. Associated symptoms include malaise/fatigue. Pertinent negatives include no peripheral edema or shortness of breath. Risk factors for coronary artery disease include dyslipidemia, obesity and sedentary lifestyle. The current treatment provides moderate improvement.  Hyperlipidemia This is a chronic problem. The current episode started more than 1 year ago. Exacerbating diseases include obesity. Pertinent negatives include no shortness of breath. Current antihyperlipidemic treatment includes statins. The current treatment provides moderate improvement of lipids. Risk factors for coronary artery disease include dyslipidemia, hypertension and a sedentary lifestyle.  Nicotine Dependence Presents for follow-up visit. Her urge triggers include company of smokers. The symptoms have been stable. She smokes 1 pack of cigarettes per day.  Osteoporosis Last dexa scan was 05/02/18. She reports she has been taking some OTC medication for bones. She also takes calcium and vit D daily.    Review of Systems  Constitutional: Positive for malaise/fatigue.  Respiratory: Negative for shortness of breath.   All other systems  reviewed and are negative.  Family History  Problem Relation Age of Onset  . Cancer Mother        ovarian  . Diabetes Father   . Colon cancer Neg Hx   . Esophageal cancer Neg Hx   . Rectal cancer Neg Hx   . Stomach cancer Neg Hx   . Colon polyps Neg Hx    Social History   Socioeconomic History  . Marital status: Widowed    Spouse name: Not on file  . Number of children: Not on file  . Years of education: Not on file  . Highest education level: Not on file  Occupational History  . Not on file  Tobacco Use  . Smoking status: Current Every Day Smoker    Packs/day: 1.00    Types: Cigarettes    Start date: 01/22/1976  . Smokeless tobacco: Never Used  Vaping Use  . Vaping Use: Never used  Substance and Sexual Activity  . Alcohol use: Not Currently    Comment: once yearly  . Drug use: No  . Sexual activity: Not Currently  Other Topics Concern  . Not on file  Social History Narrative  . Not on file   Social Determinants of Health   Financial Resource Strain: Not on file  Food Insecurity: Not on file  Transportation Needs: Not on file  Physical Activity: Not on file  Stress: Not on file  Social Connections: Not on file       Objective:   Physical Exam Vitals reviewed.  Constitutional:      General: She is not in acute distress.    Appearance: She is well-developed.  HENT:  Head: Normocephalic and atraumatic.     Right Ear: Tympanic membrane normal.     Left Ear: Tympanic membrane normal.  Eyes:     Pupils: Pupils are equal, round, and reactive to light.  Neck:     Thyroid: No thyromegaly.  Cardiovascular:     Rate and Rhythm: Normal rate and regular rhythm.     Heart sounds: Normal heart sounds. No murmur heard.   Pulmonary:     Effort: Pulmonary effort is normal. No respiratory distress.     Breath sounds: Normal breath sounds. No wheezing.  Abdominal:     General: Bowel sounds are normal. There is no distension.     Palpations: Abdomen is soft.      Tenderness: There is no abdominal tenderness.  Musculoskeletal:        General: No tenderness (no tenderness note with flexion and extension ). Normal range of motion.     Cervical back: Normal range of motion and neck supple.  Skin:    General: Skin is warm and dry.  Neurological:     Mental Status: She is alert and oriented to person, place, and time.     Cranial Nerves: No cranial nerve deficit.     Deep Tendon Reflexes: Reflexes are normal and symmetric.  Psychiatric:        Behavior: Behavior normal.        Thought Content: Thought content normal.        Judgment: Judgment normal.      BP (!) 127/58   Pulse 66   Temp 97.9 F (36.6 C) (Temporal)   Ht 5' 5"  (1.651 m)   Wt 136 lb (61.7 kg)   SpO2 98%   BMI 22.63 kg/m      Assessment & Plan:  Marjoria Mancillas comes in today with chief complaint of Medical Management of Chronic Issues and Knee Pain (Left knee pain lump and tender )   Diagnosis and orders addressed:  1. Essential hypertension - lisinopril (ZESTRIL) 10 MG tablet; TAKE 1 TABLET BY MOUTH ONCE DAILY  Dispense: 90 tablet; Refill: 3 - CMP14+EGFR - CBC with Differential/Platelet  2. Primary hypertension - CMP14+EGFR - CBC with Differential/Platelet  3. Hypercholesterolemia - CMP14+EGFR - CBC with Differential/Platelet - Lipid panel  4. Tobacco use - CMP14+EGFR - CBC with Differential/Platelet  5. Osteoporosis, unspecified osteoporosis type, unspecified pathological fracture presence - CMP14+EGFR - CBC with Differential/Platelet - VITAMIN D 25 Hydroxy (Vit-D Deficiency, Fractures) - DG [facility] DEXA  6. Annual physical exam - CMP14+EGFR - CBC with Differential/Platelet - Lipid panel - TSH - VITAMIN D 25 Hydroxy (Vit-D Deficiency, Fractures) - DG Knee 1-2 Views Left; Future - DG [facility] DEXA - diclofenac (VOLTAREN) 75 MG EC tablet; Take 1 tablet (75 mg total) by mouth 2 (two) times daily.  Dispense: 60 tablet; Refill: 2  7. Acute pain of left  knee No other NSAID's while taking diclofenac BID  Take with food - CMP14+EGFR - CBC with Differential/Platelet - DG Knee 1-2 Views Left; Future - diclofenac (VOLTAREN) 75 MG EC tablet; Take 1 tablet (75 mg total) by mouth 2 (two) times daily.  Dispense: 60 tablet; Refill: 2   Labs pending Health Maintenance reviewed Diet and exercise encouraged  Follow up plan: 1 year    Evelina Dun, FNP

## 2020-08-23 NOTE — Patient Instructions (Signed)
Acute Knee Pain, Adult Acute knee pain is sudden and may be caused by damage, swelling, or irritation of the muscles and tissues that support the knee. Pain may result from:  A fall.  An injury to the knee from twisting motions.  A hit to the knee.  Infection. Acute knee pain may go away on its own with time and rest. If it does not, your health care provider may order tests to find the cause of the pain. These may include:  Imaging tests, such as an X-ray, MRI, CT scan, or ultrasound.  Joint aspiration. In this test, fluid is removed from the knee and evaluated.  Arthroscopy. In this test, a lighted tube is inserted into the knee and an image is projected onto a TV screen.  Biopsy. In this test, a sample of tissue is removed from the body and studied under a microscope. Follow these instructions at home: If you have a knee sleeve or brace:  Wear the knee sleeve or brace as told by your health care provider. Remove it only as told by your health care provider.  Loosen it if your toes tingle, become numb, or turn cold and blue.  Keep it clean.  If the knee sleeve or brace is not waterproof: ? Do not let it get wet. ? Cover it with a watertight covering when you take a bath or shower.   Activity  Rest your knee.  Do not do things that cause pain or make pain worse.  Avoid high-impact activities or exercises, such as running, jumping rope, or doing jumping jacks.  Work with a physical therapist to make a safe exercise program, as recommended by your health care provider. Do exercises as told by your physical therapist. Managing pain, stiffness, and swelling  If directed, put ice on the affected knee. To do this: ? If you have a removable knee sleeve or brace, remove it as told by your health care provider. ? Put ice in a plastic bag. ? Place a towel between your skin and the bag. ? Leave the ice on for 20 minutes, 2-3 times a day. ? Remove the ice if your skin turns bright  red. This is very important. If you cannot feel pain, heat, or cold, you have a greater risk of damage to the area.  If directed, use an elastic bandage to put pressure (compression) on your injured knee. This may control swelling, give support, and help with discomfort.  Raise (elevate) your knee above the level of your heart while you are sitting or lying down.  Sleep with a pillow under your knee.   General instructions  Take over-the-counter and prescription medicines only as told by your health care provider.  Do not use any products that contain nicotine or tobacco, such as cigarettes, e-cigarettes, and chewing tobacco. If you need help quitting, ask your health care provider.  If you are overweight, work with your health care provider and a dietitian to set a weight-loss goal that is healthy and reasonable for you. Extra weight can put pressure on your knee.  Pay attention to any changes in your symptoms.  Keep all follow-up visits. This is important. Contact a health care provider if:  Your knee pain continues, changes, or gets worse.  You have a fever along with knee pain.  Your knee feels warm to the touch or is red.  Your knee buckles or locks up. Get help right away if:  Your knee swells, and the swelling becomes   worse.  You cannot move your knee.  You have severe pain in your knee that cannot be managed with pain medicine. Summary  Acute knee pain can be caused by a fall, an injury, an infection, or damage, swelling, or irritation of the tissues that support your knee.  Your health care provider may perform tests to find out the cause of the pain.  Pay attention to any changes in your symptoms. Relieve your pain with rest, medicines, light activity, and the use of ice.  Get help right away if your knee swells, you cannot move your knee, or you have severe pain that cannot be managed with medicine. This information is not intended to replace advice given to you  by your health care provider. Make sure you discuss any questions you have with your health care provider. Document Revised: 08/20/2019 Document Reviewed: 08/20/2019 Elsevier Patient Education  2021 [employer]  

## 2020-08-27 ENCOUNTER — Ambulatory Visit (INDEPENDENT_AMBULATORY_CARE_PROVIDER_SITE_OTHER): Payer: BC Managed Care – PPO

## 2020-08-27 ENCOUNTER — Other Ambulatory Visit: Payer: BC Managed Care – PPO

## 2020-08-27 ENCOUNTER — Other Ambulatory Visit (INDEPENDENT_AMBULATORY_CARE_PROVIDER_SITE_OTHER): Payer: BC Managed Care – PPO

## 2020-08-27 ENCOUNTER — Other Ambulatory Visit: Payer: Self-pay | Admitting: Family

## 2020-08-27 ENCOUNTER — Other Ambulatory Visit: Payer: Self-pay

## 2020-08-27 DIAGNOSIS — M810 Age-related osteoporosis without current pathological fracture: Secondary | ICD-10-CM

## 2020-08-27 DIAGNOSIS — M25562 Pain in left knee: Secondary | ICD-10-CM | POA: Diagnosis not present

## 2020-08-27 DIAGNOSIS — E7800 Pure hypercholesterolemia, unspecified: Secondary | ICD-10-CM | POA: Diagnosis not present

## 2020-08-27 DIAGNOSIS — Z720 Tobacco use: Secondary | ICD-10-CM | POA: Diagnosis not present

## 2020-08-27 DIAGNOSIS — Z0000 Encounter for general adult medical examination without abnormal findings: Secondary | ICD-10-CM | POA: Diagnosis not present

## 2020-08-27 DIAGNOSIS — I10 Essential (primary) hypertension: Secondary | ICD-10-CM | POA: Diagnosis not present

## 2020-08-27 DIAGNOSIS — Z780 Asymptomatic menopausal state: Secondary | ICD-10-CM | POA: Diagnosis not present

## 2020-08-27 DIAGNOSIS — M85832 Other specified disorders of bone density and structure, left forearm: Secondary | ICD-10-CM | POA: Diagnosis not present

## 2020-08-27 MED ORDER — ALENDRONATE SODIUM 70 MG PO TABS: 70.0000 mg | ORAL_TABLET | ORAL | 11 refills | Status: AC

## 2020-08-28 LAB — CBC WITH DIFFERENTIAL/PLATELET
Basophils Absolute: 0.1 10*3/uL (ref 0.0–0.2)
Basos: 1 %
EOS (ABSOLUTE): 0.3 10*3/uL (ref 0.0–0.4)
Eos: 4 %
Hematocrit: 41.3 % (ref 34.0–46.6)
Hemoglobin: 13.6 g/dL (ref 11.1–15.9)
Immature Grans (Abs): 0 10*3/uL (ref 0.0–0.1)
Immature Granulocytes: 0 %
Lymphocytes Absolute: 3.1 10*3/uL (ref 0.7–3.1)
Lymphs: 38 %
MCH: 31.7 pg (ref 26.6–33.0)
MCHC: 32.9 g/dL (ref 31.5–35.7)
MCV: 96 fL (ref 79–97)
Monocytes Absolute: 0.5 10*3/uL (ref 0.1–0.9)
Monocytes: 6 %
Neutrophils Absolute: 4.2 10*3/uL (ref 1.4–7.0)
Neutrophils: 51 %
Platelets: 244 10*3/uL (ref 150–450)
RBC: 4.29 x10E6/uL (ref 3.77–5.28)
RDW: 11.8 % (ref 11.7–15.4)
WBC: 8.3 10*3/uL (ref 3.4–10.8)

## 2020-08-28 LAB — CMP14+EGFR
ALT: 11 IU/L (ref 0–32)
AST: 17 IU/L (ref 0–40)
Albumin/Globulin Ratio: 1.6 (ref 1.2–2.2)
Albumin: 4 g/dL (ref 3.8–4.8)
Alkaline Phosphatase: 88 IU/L (ref 44–121)
BUN/Creatinine Ratio: 14 (ref 12–28)
BUN: 10 mg/dL (ref 8–27)
Bilirubin Total: 0.3 mg/dL (ref 0.0–1.2)
CO2: 25 mmol/L (ref 20–29)
Calcium: 9.3 mg/dL (ref 8.7–10.3)
Chloride: 104 mmol/L (ref 96–106)
Creatinine, Ser: 0.71 mg/dL (ref 0.57–1.00)
Globulin, Total: 2.5 g/dL (ref 1.5–4.5)
Glucose: 82 mg/dL (ref 65–99)
Potassium: 4.8 mmol/L (ref 3.5–5.2)
Sodium: 141 mmol/L (ref 134–144)
Total Protein: 6.5 g/dL (ref 6.0–8.5)
eGFR: 96 mL/min/{1.73_m2} (ref >59)

## 2020-08-28 LAB — LIPID PANEL
Chol/HDL Ratio: 2.4 ratio (ref 0.0–4.4)
Cholesterol, Total: 146 mg/dL (ref 100–199)
HDL: 60 mg/dL (ref >39)
LDL Chol Calc (NIH): 75 mg/dL (ref 0–99)
Triglycerides: 52 mg/dL (ref 0–149)
VLDL Cholesterol Cal: 11 mg/dL (ref 5–40)

## 2020-08-28 LAB — TSH: TSH: 2.88 u[IU]/mL (ref 0.450–4.500)

## 2020-08-28 LAB — VITAMIN D 25 HYDROXY (VIT D DEFICIENCY, FRACTURES): Vit D, 25-Hydroxy: 44.1 ng/mL (ref 30.0–100.0)

## 2020-08-31 ENCOUNTER — Other Ambulatory Visit: Payer: Self-pay | Admitting: Family

## 2021-03-15 ENCOUNTER — Ambulatory Visit: Payer: BC Managed Care – PPO | Admitting: Family

## 2021-03-15 ENCOUNTER — Encounter: Payer: Self-pay | Admitting: Family

## 2021-03-15 VITALS — BP 136/57 | HR 54 | Temp 96.6°F | Ht 65.0 in | Wt 137.4 lb

## 2021-03-15 DIAGNOSIS — M810 Age-related osteoporosis without current pathological fracture: Secondary | ICD-10-CM

## 2021-03-15 DIAGNOSIS — E7800 Pure hypercholesterolemia, unspecified: Secondary | ICD-10-CM | POA: Diagnosis not present

## 2021-03-15 DIAGNOSIS — Z23 Encounter for immunization: Secondary | ICD-10-CM

## 2021-03-15 DIAGNOSIS — Z720 Tobacco use: Secondary | ICD-10-CM | POA: Diagnosis not present

## 2021-03-15 DIAGNOSIS — I10 Essential (primary) hypertension: Secondary | ICD-10-CM

## 2021-03-15 NOTE — Patient Instructions (Signed)
Osteoporosis Osteoporosis happens when the bones become thin and less dense than normal. Osteoporosis makes bones more brittle and fragile and more likely to break (fracture). Over time, osteoporosis can cause your bones to become so weak that they fracture after a minor fall. Bones in the hip, wrist, and spine are most likely to fracture due to osteoporosis. What are the causes? The exact cause of this condition is not known. What increases the risk? You are more likely to develop this condition if you: Have family members with this condition. Have poor nutrition. Use the following: Steroid medicines, such as prednisone. Anti-seizure medicines. Nicotine or tobacco, such as cigarettes, e-cigarettes, and chewing tobacco. Are female. Are age 28 or older. Are not physically active (are sedentary). Are of European or Asian descent. Have a small body frame. What are the signs or symptoms? A fracture might be the first sign of osteoporosis, especially if the fracture results from a fall or injury that usually would not cause a bone to break. Other signs and symptoms include: Pain in the neck or low back. Stooped posture. Loss of height. How is this diagnosed? This condition may be diagnosed based on: Your medical history. A physical exam. A bone mineral density test, also called a DXA or DEXA test (dual-energy X-ray absorptiometry test). This test uses X-rays to measure the amount of minerals in your bones. How is this treated? This condition may be treated by: Making lifestyle changes, such as: Including foods with more calcium and vitamin D in your diet. Doing weight-bearing and muscle-strengthening exercises. Stopping tobacco use. Limiting alcohol intake. Taking medicine to slow the process of bone loss or to increase bone density. Taking daily supplements of calcium and vitamin D. Taking hormone replacement medicines, such as estrogen for women and testosterone for men. Monitoring  your levels of calcium and vitamin D. The goal of treatment is to strengthen your bones and lower your risk for a fracture. Follow these instructions at home: Eating and drinking Include calcium and vitamin D in your diet. Calcium is important for bone health, and vitamin D helps your body absorb calcium. Good sources of calcium and vitamin D include: Certain fatty fish, such as salmon and tuna. Products that have calcium and vitamin D added to them (are fortified), such as fortified cereals. Egg yolks. Cheese. Liver.  Activity Do exercises as told by your health care provider. Ask your health care provider what exercises and activities are safe for you. You should do: Exercises that make you work against gravity (weight-bearing exercises), such as tai chi, yoga, or walking. Exercises to strengthen muscles, such as lifting weights. Lifestyle Do not drink alcohol if: Your health care provider tells you not to drink. You are pregnant, may be pregnant, or are planning to become pregnant. If you drink alcohol: Limit how much you use to: 0-1 drink a day for women. 0-2 drinks a day for men. Know how much alcohol is in your drink. In the [place] one drink equals one 12 oz bottle of beer (355 mL), one 5 oz glass of wine (148 mL), or one 1 oz glass of hard liquor (44 mL). Do not use any products that contain nicotine or tobacco, such as cigarettes, e-cigarettes, and chewing tobacco. If you need help quitting, ask your health care provider. Preventing falls Use devices to help you move around (mobility aids) as needed, such as canes, walkers, scooters, or crutches. Keep rooms well-lit and clutter-free. Remove tripping hazards from walkways, including cords and throw  rugs. °Install grab bars in bathrooms and safety rails on stairs. °Use rubber mats in the bathroom and other areas that are often wet or slippery. °Wear closed-toe shoes that fit well and support your feet. Wear shoes that have rubber  soles or low heels. °Review your medicines with your health care provider. Some medicines can cause dizziness or changes in blood pressure, which can increase your risk of falling. °General instructions °Take over-the-counter and prescription medicines only as told by your health care provider. °Keep all follow-up visits. This is important. °Contact a health care provider if: °You have never been screened for osteoporosis and you are: °A woman who is age 65 or older. °A man who is age 70 or older. °Get help right away if: °You fall or injure yourself. °Summary °Osteoporosis is thinning and loss of density in your bones. This makes bones more brittle and fragile and more likely to break (fracture),even with minor falls. °The goal of treatment is to strengthen your bones and lower your risk for a fracture. °Include calcium and vitamin D in your diet. Calcium is important for bone health, and vitamin D helps your body absorb calcium. °Talk with your health care provider about screening for osteoporosis if you are a woman who is age 65 or older, or a man who is age 70 or older. °This information is not intended to replace advice given to you by your health care provider. Make sure you discuss any questions you have with your health care provider. °Document Revised: 08/21/2019 Document Reviewed: 08/21/2019 °Elsevier Patient Education © 2022 [employer] ° °

## 2021-03-15 NOTE — Progress Notes (Signed)
Subjective:    Patient ID: Julie Wiggins, female    DOB: August 27, 1955, 65 y.o.   MRN: [id number]  Chief Complaint  Patient presents with   Medical Management of Chronic Issues   Pt presents to the office today for chronic follow up. She has osteoporosis and taking fosamax weekly. Her last dexascan was 08/27/20. Hypertension This is a chronic problem. The current episode started more than 1 year ago. The problem has been resolved since onset. The problem is controlled. Pertinent negatives include no malaise/fatigue, peripheral edema or shortness of breath. Risk factors for coronary artery disease include dyslipidemia, sedentary lifestyle and smoking/tobacco exposure. The current treatment provides moderate improvement.  Hyperlipidemia This is a chronic problem. The current episode started more than 1 year ago. Pertinent negatives include no shortness of breath. Current antihyperlipidemic treatment includes statins. The current treatment provides moderate improvement of lipids. Risk factors for coronary artery disease include dyslipidemia, hypertension, a sedentary lifestyle and post-menopausal.  Nicotine Dependence Presents for follow-up visit. Her urge triggers include company of smokers. The symptoms have been stable. She smokes 1 pack of cigarettes per day.     Review of Systems  Constitutional:  Negative for malaise/fatigue.  Respiratory:  Negative for shortness of breath.   All other systems reviewed and are negative.     Objective:   Physical Exam Vitals reviewed.  Constitutional:      General: She is not in acute distress.    Appearance: She is well-developed.  HENT:     Head: Normocephalic and atraumatic.     Right Ear: Tympanic membrane normal.     Left Ear: Tympanic membrane normal.  Eyes:     Pupils: Pupils are equal, round, and reactive to light.  Neck:     Thyroid: No thyromegaly.  Cardiovascular:     Rate and Rhythm: Normal rate and regular rhythm.     Heart sounds:  Normal heart sounds. No murmur heard. Pulmonary:     Effort: Pulmonary effort is normal. No respiratory distress.     Breath sounds: Normal breath sounds. No wheezing.  Abdominal:     General: Bowel sounds are normal. There is no distension.     Palpations: Abdomen is soft.     Tenderness: There is no abdominal tenderness.  Musculoskeletal:        General: No tenderness. Normal range of motion.     Cervical back: Normal range of motion and neck supple.  Skin:    General: Skin is warm and dry.  Neurological:     Mental Status: She is alert and oriented to person, place, and time.     Cranial Nerves: No cranial nerve deficit.     Deep Tendon Reflexes: Reflexes are normal and symmetric.  Psychiatric:        Behavior: Behavior normal.        Thought Content: Thought content normal.        Judgment: Judgment normal.      BP (!) 136/57    Pulse (!) 54    Temp (!) 96.6 F (35.9 C) (Temporal)    Ht 5' 5"  (1.651 m)    Wt 137 lb 6.4 oz (62.3 kg)    BMI 22.86 kg/m      Assessment & Plan:  Julie Wiggins comes in today with chief complaint of Medical Management of Chronic Issues   Diagnosis and orders addressed:  1. Need for immunization against influenza - Flu Vaccine QUAD 40moIM (Fluarix, Fluzone & Alfiuria Quad PF) -  CMP14+EGFR - CBC with Differential/Platelet  2. Primary hypertension - CMP14+EGFR - CBC with Differential/Platelet  3. Hypercholesterolemia - CMP14+EGFR - CBC with Differential/Platelet  4. Tobacco use - CMP14+EGFR - CBC with Differential/Platelet  5. Osteoporosis, unspecified osteoporosis type, unspecified pathological fracture presence - CMP14+EGFR - CBC with Differential/Platelet   Labs pending Health Maintenance reviewed Diet and exercise encouraged  Follow up plan: 6 months   Evelina Dun, FNP

## 2021-03-16 LAB — CMP14+EGFR
ALT: 10 IU/L (ref 0–32)
AST: 15 IU/L (ref 0–40)
Albumin/Globulin Ratio: 1.4 (ref 1.2–2.2)
Albumin: 3.9 g/dL (ref 3.8–4.8)
Alkaline Phosphatase: 100 IU/L (ref 44–121)
BUN/Creatinine Ratio: 13 (ref 12–28)
BUN: 9 mg/dL (ref 8–27)
Bilirubin Total: 0.3 mg/dL (ref 0.0–1.2)
CO2: 27 mmol/L (ref 20–29)
Calcium: 9.2 mg/dL (ref 8.7–10.3)
Chloride: 102 mmol/L (ref 96–106)
Creatinine, Ser: 0.68 mg/dL (ref 0.57–1.00)
Globulin, Total: 2.8 g/dL (ref 1.5–4.5)
Glucose: 86 mg/dL (ref 70–99)
Potassium: 4.8 mmol/L (ref 3.5–5.2)
Sodium: 139 mmol/L (ref 134–144)
Total Protein: 6.7 g/dL (ref 6.0–8.5)
eGFR: 98 mL/min/{1.73_m2} (ref >59)

## 2021-03-16 LAB — CBC WITH DIFFERENTIAL/PLATELET
Basophils Absolute: 0.1 10*3/uL (ref 0.0–0.2)
Basos: 1 %
EOS (ABSOLUTE): 0.2 10*3/uL (ref 0.0–0.4)
Eos: 2 %
Hematocrit: 42.8 % (ref 34.0–46.6)
Hemoglobin: 14.4 g/dL (ref 11.1–15.9)
Immature Grans (Abs): 0 10*3/uL (ref 0.0–0.1)
Immature Granulocytes: 0 %
Lymphocytes Absolute: 3.2 10*3/uL — ABNORMAL HIGH (ref 0.7–3.1)
Lymphs: 35 %
MCH: 32.2 pg (ref 26.6–33.0)
MCHC: 33.6 g/dL (ref 31.5–35.7)
MCV: 96 fL (ref 79–97)
Monocytes Absolute: 0.6 10*3/uL (ref 0.1–0.9)
Monocytes: 6 %
Neutrophils Absolute: 5.1 10*3/uL (ref 1.4–7.0)
Neutrophils: 56 %
Platelets: 280 10*3/uL (ref 150–450)
RBC: 4.47 x10E6/uL (ref 3.77–5.28)
RDW: 11.8 % (ref 11.7–15.4)
WBC: 9.1 10*3/uL (ref 3.4–10.8)

## 2021-06-04 DIAGNOSIS — H5213 Myopia, bilateral: Secondary | ICD-10-CM | POA: Diagnosis not present

## 2021-06-04 DIAGNOSIS — H40033 Anatomical narrow angle, bilateral: Secondary | ICD-10-CM | POA: Diagnosis not present

## 2021-06-04 DIAGNOSIS — H04123 Dry eye syndrome of bilateral lacrimal glands: Secondary | ICD-10-CM | POA: Diagnosis not present

## 2021-06-07 DIAGNOSIS — H10013 Acute follicular conjunctivitis, bilateral: Secondary | ICD-10-CM | POA: Diagnosis not present

## 2021-08-01 DIAGNOSIS — Z1231 Encounter for screening mammogram for malignant neoplasm of breast: Secondary | ICD-10-CM | POA: Diagnosis not present

## 2021-10-10 ENCOUNTER — Encounter: Payer: Self-pay | Admitting: Family

## 2021-10-10 ENCOUNTER — Ambulatory Visit: Payer: BC Managed Care – PPO | Admitting: Family

## 2021-10-10 VITALS — BP 127/64 | HR 67 | Temp 97.4°F | Ht 65.0 in | Wt 127.6 lb

## 2021-10-10 DIAGNOSIS — Z23 Encounter for immunization: Secondary | ICD-10-CM

## 2021-10-10 DIAGNOSIS — I10 Essential (primary) hypertension: Secondary | ICD-10-CM

## 2021-10-10 DIAGNOSIS — Z0001 Encounter for general adult medical examination with abnormal findings: Secondary | ICD-10-CM | POA: Diagnosis not present

## 2021-10-10 DIAGNOSIS — Z0000 Encounter for general adult medical examination without abnormal findings: Secondary | ICD-10-CM

## 2021-10-10 DIAGNOSIS — E7800 Pure hypercholesterolemia, unspecified: Secondary | ICD-10-CM

## 2021-10-10 DIAGNOSIS — M810 Age-related osteoporosis without current pathological fracture: Secondary | ICD-10-CM | POA: Diagnosis not present

## 2021-10-10 DIAGNOSIS — Z720 Tobacco use: Secondary | ICD-10-CM

## 2021-10-10 NOTE — Progress Notes (Signed)
Subjective:    Patient ID: Julie Wiggins, female    DOB: [birth year], 66 y.o.   MRN: [id number]  Chief Complaint  Patient presents with   Medical Management of Chronic Issues   Pt presents to the office today for CPE. She has osteoporosis and taking fosamax weekly. Her last dexascan was 08/27/20. Hypertension This is a chronic problem. The current episode started more than 1 year ago. The problem has been resolved since onset. The problem is controlled. Pertinent negatives include no malaise/fatigue, peripheral edema or shortness of breath. Risk factors for coronary artery disease include dyslipidemia. The current treatment provides moderate improvement.  Hyperlipidemia This is a chronic problem. The current episode started more than 1 year ago. The problem is controlled. Pertinent negatives include no shortness of breath. Current antihyperlipidemic treatment includes statins. The current treatment provides moderate improvement of lipids. Risk factors for coronary artery disease include dyslipidemia, hypertension, a sedentary lifestyle and post-menopausal.  Nicotine Dependence Presents for follow-up visit. Her urge triggers include company of smokers. The symptoms have been stable. She smokes 1 pack of cigarettes per day.      Review of Systems  Constitutional:  Negative for malaise/fatigue.  Respiratory:  Negative for shortness of breath.   All other systems reviewed and are negative.  Family History  Problem Relation Age of Onset   Cancer Mother        ovarian   Diabetes Father    Colon cancer Neg Hx    Esophageal cancer Neg Hx    Rectal cancer Neg Hx    Stomach cancer Neg Hx    Colon polyps Neg Hx    Social History   Socioeconomic History   Marital status: Widowed    Spouse name: Not on file   Number of children: Not on file   Years of education: Not on file   Highest education level: Not on file  Occupational History   Not on file  Tobacco Use   Smoking status: Every  Day    Packs/day: 1.00    Types: Cigarettes    Start date: 01/22/1976   Smokeless tobacco: Never  Vaping Use   Vaping Use: Never used  Substance and Sexual Activity   Alcohol use: Not Currently    Comment: once yearly   Drug use: No   Sexual activity: Not Currently  Other Topics Concern   Not on file  Social History Narrative   Not on file   Social Determinants of Health   Financial Resource Strain: Not on file  Food Insecurity: Not on file  Transportation Needs: Not on file  Physical Activity: Not on file  Stress: Not on file  Social Connections: Not on file       Objective:   Physical Exam Vitals reviewed.  Constitutional:      General: She is not in acute distress.    Appearance: She is well-developed.  HENT:     Head: Normocephalic and atraumatic.     Right Ear: Tympanic membrane normal.     Left Ear: Tympanic membrane normal.  Eyes:     Pupils: Pupils are equal, round, and reactive to light.  Neck:     Thyroid: No thyromegaly.  Cardiovascular:     Rate and Rhythm: Normal rate and regular rhythm.     Heart sounds: Normal heart sounds. No murmur heard. Pulmonary:     Effort: Pulmonary effort is normal. No respiratory distress.     Breath sounds: Normal breath sounds. No wheezing.  Abdominal:     General: Bowel sounds are normal. There is no distension.     Palpations: Abdomen is soft.     Tenderness: There is no abdominal tenderness.  Musculoskeletal:        General: No tenderness. Normal range of motion.     Cervical back: Normal range of motion and neck supple.  Skin:    General: Skin is warm and dry.  Neurological:     Mental Status: She is alert and oriented to person, place, and time.     Cranial Nerves: No cranial nerve deficit.     Deep Tendon Reflexes: Reflexes are normal and symmetric.  Psychiatric:        Behavior: Behavior normal.        Thought Content: Thought content normal.        Judgment: Judgment normal.          BP 127/64    Pulse 67   Temp (!) 97.4 F (36.3 C)   Ht 5' 5"  (1.651 m)   Wt 127 lb 9.6 oz (57.9 kg)   SpO2 99%   BMI 21.23 kg/m   Assessment & Plan:  Julie Wiggins comes in today with chief complaint of Medical Management of Chronic Issues   Diagnosis and orders addressed:  1. Need for Tdap vaccination - Tdap vaccine greater than or equal to 7yo IM - CMP14+EGFR - CBC with Differential/Platelet  2. Primary hypertension - CMP14+EGFR - CBC with Differential/Platelet  3. Osteoporosis, unspecified osteoporosis type, unspecified pathological fracture presence - CMP14+EGFR - CBC with Differential/Platelet  4. Hypercholesterolemia  - CMP14+EGFR - CBC with Differential/Platelet  5. Tobacco use - CMP14+EGFR - CBC with Differential/Platelet  6. Annual physical exam - CMP14+EGFR - CBC with Differential/Platelet - Lipid panel - TSH   Labs pending Health Maintenance reviewed Diet and exercise encouraged  Follow up plan: 6 months    Evelina Dun, FNP

## 2021-10-10 NOTE — Patient Instructions (Signed)

## 2021-10-11 LAB — CBC WITH DIFFERENTIAL/PLATELET
Basophils Absolute: 0.1 10*3/uL (ref 0.0–0.2)
Basos: 1 %
EOS (ABSOLUTE): 0.3 10*3/uL (ref 0.0–0.4)
Eos: 4 %
Hematocrit: 42.6 % (ref 34.0–46.6)
Hemoglobin: 14.1 g/dL (ref 11.1–15.9)
Immature Grans (Abs): 0 10*3/uL (ref 0.0–0.1)
Immature Granulocytes: 0 %
Lymphocytes Absolute: 2.8 10*3/uL (ref 0.7–3.1)
Lymphs: 33 %
MCH: 31.8 pg (ref 26.6–33.0)
MCHC: 33.1 g/dL (ref 31.5–35.7)
MCV: 96 fL (ref 79–97)
Monocytes Absolute: 0.5 10*3/uL (ref 0.1–0.9)
Monocytes: 6 %
Neutrophils Absolute: 4.9 10*3/uL (ref 1.4–7.0)
Neutrophils: 56 %
Platelets: 264 10*3/uL (ref 150–450)
RBC: 4.43 x10E6/uL (ref 3.77–5.28)
RDW: 11.8 % (ref 11.7–15.4)
WBC: 8.6 10*3/uL (ref 3.4–10.8)

## 2021-10-11 LAB — CMP14+EGFR
ALT: 9 IU/L (ref 0–32)
AST: 14 IU/L (ref 0–40)
Albumin/Globulin Ratio: 1.6 (ref 1.2–2.2)
Albumin: 4.2 g/dL (ref 3.9–4.9)
Alkaline Phosphatase: 85 IU/L (ref 44–121)
BUN/Creatinine Ratio: 14 (ref 12–28)
BUN: 9 mg/dL (ref 8–27)
Bilirubin Total: 0.3 mg/dL (ref 0.0–1.2)
CO2: 28 mmol/L (ref 20–29)
Calcium: 9.6 mg/dL (ref 8.7–10.3)
Chloride: 102 mmol/L (ref 96–106)
Creatinine, Ser: 0.66 mg/dL (ref 0.57–1.00)
Globulin, Total: 2.6 g/dL (ref 1.5–4.5)
Glucose: 89 mg/dL (ref 70–99)
Potassium: 5 mmol/L (ref 3.5–5.2)
Sodium: 141 mmol/L (ref 134–144)
Total Protein: 6.8 g/dL (ref 6.0–8.5)
eGFR: 99 mL/min/{1.73_m2} (ref >59)

## 2021-10-11 LAB — LIPID PANEL
Chol/HDL Ratio: 2.1 ratio (ref 0.0–4.4)
Cholesterol, Total: 131 mg/dL (ref 100–199)
HDL: 62 mg/dL (ref >39)
LDL Chol Calc (NIH): 55 mg/dL (ref 0–99)
Triglycerides: 69 mg/dL (ref 0–149)
VLDL Cholesterol Cal: 14 mg/dL (ref 5–40)

## 2021-10-11 LAB — TSH: TSH: 3.04 u[IU]/mL (ref 0.450–4.500)

## 2021-10-13 ENCOUNTER — Other Ambulatory Visit: Payer: Self-pay | Admitting: Family

## 2021-10-13 DIAGNOSIS — I10 Essential (primary) hypertension: Secondary | ICD-10-CM

## 2021-10-31 ENCOUNTER — Other Ambulatory Visit: Payer: Self-pay | Admitting: Family

## 2021-11-20 IMAGING — DX DG KNEE 1-2V*L*
2 series · 2 of 2 positions shown · non-contrast
Comparison: None.

CLINICAL DATA: Left knee pain

EXAM:
LEFT KNEE - 1-2 VIEW

[knee ap]
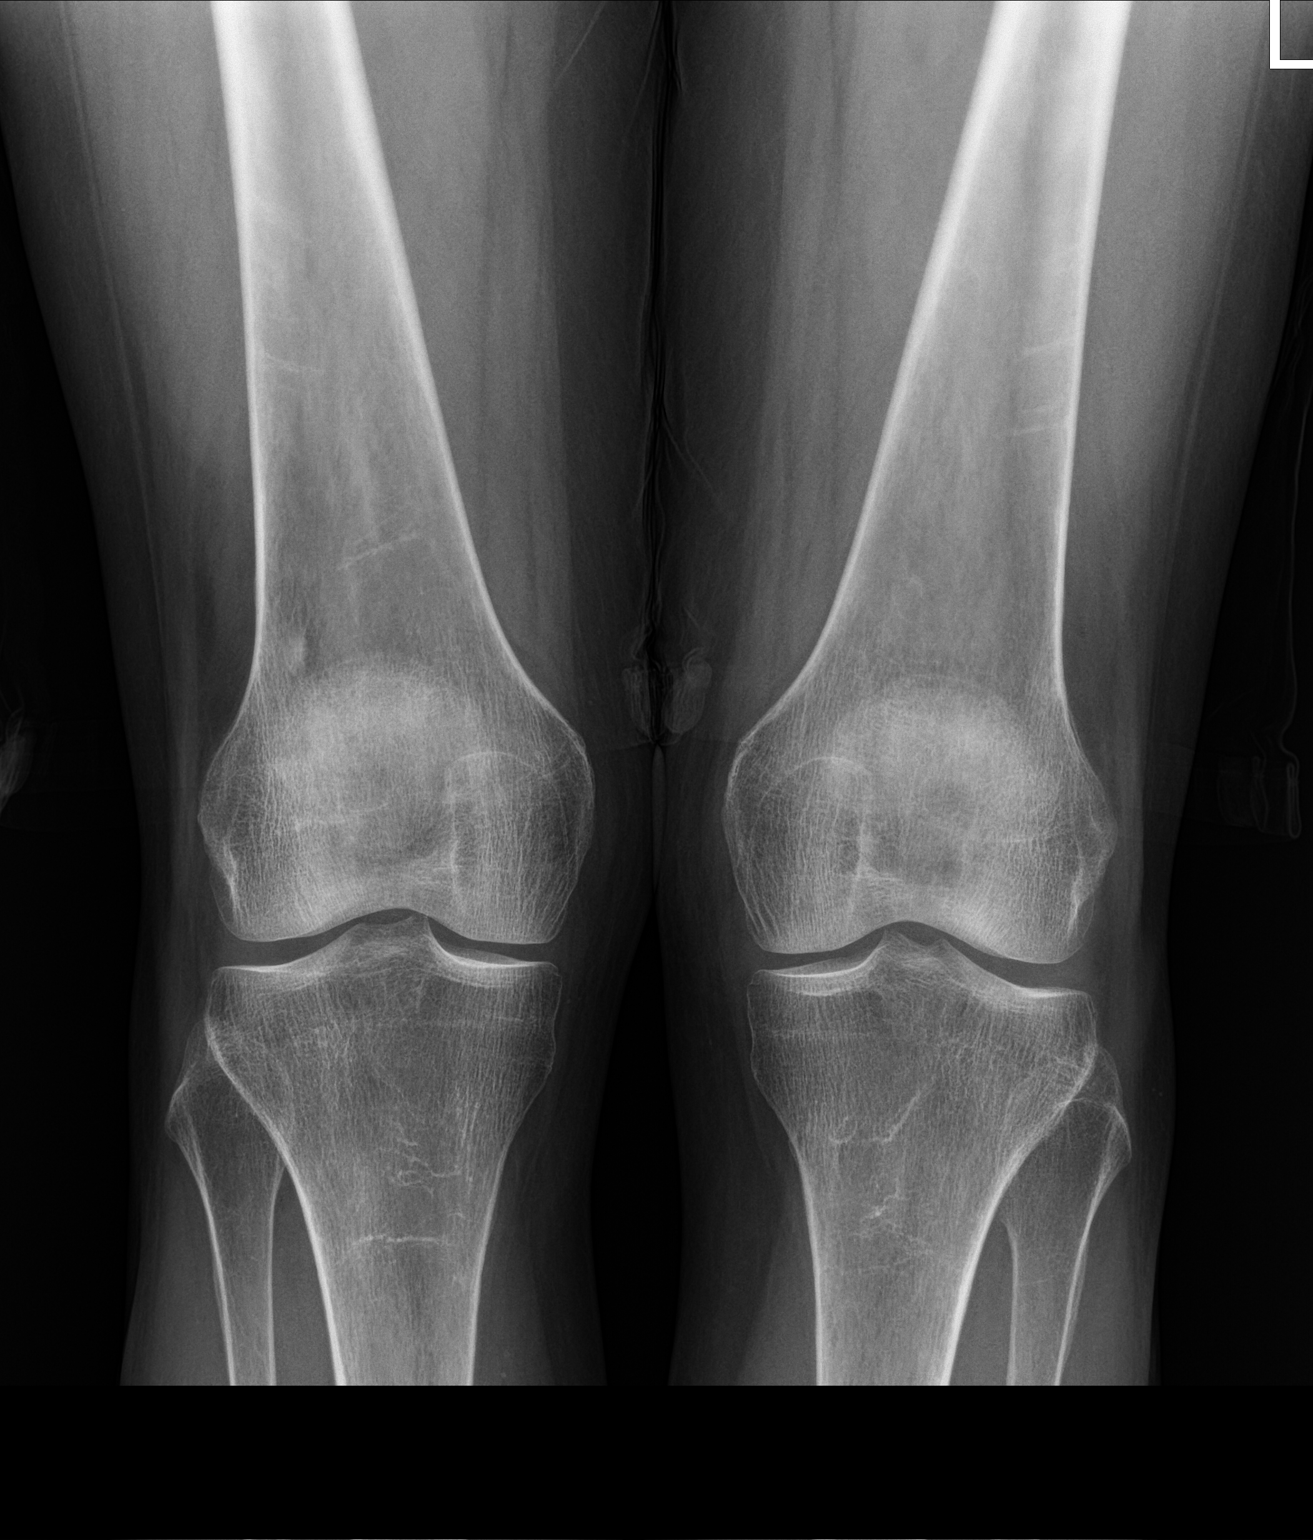

[knee lat]
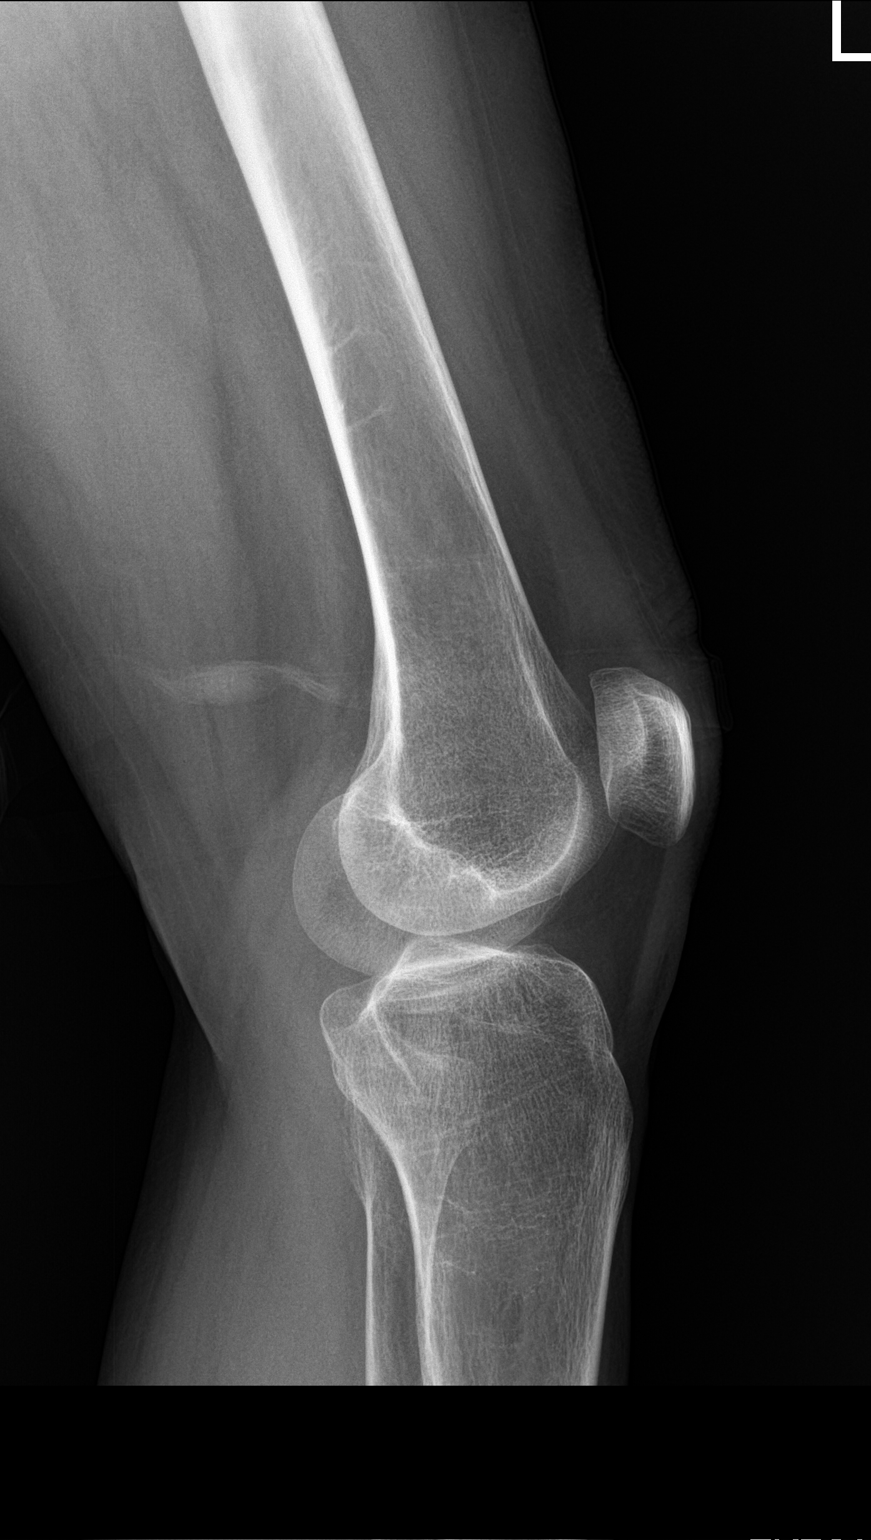

[2 of 2 positions shown; findings below may reference images not displayed]

FINDINGS: No evidence of fracture, dislocation, or joint effusion. No evidence
of arthropathy or other focal bone abnormality. Soft tissues are
unremarkable.
IMPRESSION: Negative.

## 2021-12-12 DIAGNOSIS — L600 Ingrowing nail: Secondary | ICD-10-CM | POA: Diagnosis not present

## 2021-12-12 DIAGNOSIS — L03032 Cellulitis of left toe: Secondary | ICD-10-CM | POA: Diagnosis not present

## 2021-12-12 DIAGNOSIS — M79672 Pain in left foot: Secondary | ICD-10-CM | POA: Diagnosis not present

## 2021-12-12 DIAGNOSIS — M79675 Pain in left toe(s): Secondary | ICD-10-CM | POA: Diagnosis not present

## 2021-12-26 DIAGNOSIS — H43393 Other vitreous opacities, bilateral: Secondary | ICD-10-CM | POA: Diagnosis not present

## 2021-12-26 DIAGNOSIS — H40033 Anatomical narrow angle, bilateral: Secondary | ICD-10-CM | POA: Diagnosis not present

## 2021-12-29 DIAGNOSIS — L03032 Cellulitis of left toe: Secondary | ICD-10-CM | POA: Diagnosis not present

## 2021-12-29 DIAGNOSIS — M79672 Pain in left foot: Secondary | ICD-10-CM | POA: Diagnosis not present

## 2021-12-29 DIAGNOSIS — M79675 Pain in left toe(s): Secondary | ICD-10-CM | POA: Diagnosis not present

## 2022-02-16 DIAGNOSIS — H43811 Vitreous degeneration, right eye: Secondary | ICD-10-CM | POA: Diagnosis not present

## 2022-02-16 DIAGNOSIS — H33311 Horseshoe tear of retina without detachment, right eye: Secondary | ICD-10-CM | POA: Diagnosis not present

## 2022-04-15 ENCOUNTER — Other Ambulatory Visit: Payer: Self-pay | Admitting: Family

## 2022-04-15 DIAGNOSIS — I10 Essential (primary) hypertension: Secondary | ICD-10-CM

## 2022-04-20 DIAGNOSIS — H43813 Vitreous degeneration, bilateral: Secondary | ICD-10-CM | POA: Diagnosis not present

## 2022-04-20 DIAGNOSIS — H4313 Vitreous hemorrhage, bilateral: Secondary | ICD-10-CM | POA: Diagnosis not present

## 2022-04-20 DIAGNOSIS — H59811 Chorioretinal scars after surgery for detachment, right eye: Secondary | ICD-10-CM | POA: Diagnosis not present

## 2022-04-26 ENCOUNTER — Other Ambulatory Visit: Payer: Self-pay | Admitting: Family

## 2022-05-10 ENCOUNTER — Ambulatory Visit: Payer: BC Managed Care – PPO | Admitting: Family Medicine

## 2022-05-10 ENCOUNTER — Encounter: Payer: Self-pay | Admitting: Family Medicine

## 2022-05-10 VITALS — BP 130/74 | HR 79 | Temp 98.3°F | Ht 65.0 in | Wt 124.0 lb

## 2022-05-10 DIAGNOSIS — J029 Acute pharyngitis, unspecified: Secondary | ICD-10-CM | POA: Diagnosis not present

## 2022-05-10 DIAGNOSIS — R070 Pain in throat: Secondary | ICD-10-CM | POA: Diagnosis not present

## 2022-05-10 LAB — RAPID STREP SCREEN (MED CTR MEBANE ONLY): Strep Gp A Ag, IA W/Reflex: NEGATIVE

## 2022-05-10 LAB — CULTURE, GROUP A STREP

## 2022-05-10 NOTE — Patient Instructions (Signed)
Recommend salt water gargles and increase fluid intake and ibuprofen.

## 2022-05-10 NOTE — Progress Notes (Signed)
BP 130/74   Pulse 79   Temp 98.3 F (36.8 C)   Ht 5' 5"$  (1.651 m)   Wt 124 lb (56.2 kg)   SpO2 96%   BMI 20.63 kg/m    Subjective:   Patient ID: Julie Wiggins, female    DOB: May 26, 1955, 67 y.o.   MRN: [id number]  HPI: Julie Wiggins is a 67 y.o. female presenting on 05/10/2022 for Sore Throat (Started 2d ago, not improving)   HPI Sore throat Patient is coming in today with complaints of throat pain that started 2 days ago.  She says only hurts in the left side of her throat.  It does hurt someone swallowing on the left side.  She denies any cough or normal cough.  She denies any shortness of breath or fever.  She has not used anything over-the-counter for it.  She says it just hurts on that left side and does not go anywhere else.  She has not had any new sinus congestion or pressure.  She really denies any other symptoms except for a sore throat on the left side  Relevant past medical, surgical, family and social history reviewed and updated as indicated. Interim medical history since our last visit reviewed. Allergies and medications reviewed and updated.  Review of Systems  Constitutional:  Negative for chills and fever.  HENT:  Positive for congestion and sore throat. Negative for ear discharge, ear pain, postnasal drip, rhinorrhea, sinus pressure and sneezing.   Eyes:  Negative for visual disturbance.  Respiratory:  Negative for cough, chest tightness and shortness of breath.   Cardiovascular:  Negative for chest pain and leg swelling.  Musculoskeletal:  Negative for back pain and gait problem.  Skin:  Negative for rash.  Neurological:  Negative for light-headedness and headaches.  Psychiatric/Behavioral:  Negative for agitation and behavioral problems.   All other systems reviewed and are negative.   Per HPI unless specifically indicated above   Allergies as of 05/10/2022   No Known Allergies      Medication List        Accurate as of May 10, 2022  4:16 PM. If  you have any questions, ask your nurse or doctor.          aspirin EC 81 MG tablet Take 81 mg by mouth daily.   atorvastatin 40 MG tablet Commonly known as: LIPITOR Take 1 tablet by mouth once daily   GARLIQUE PO Take by mouth.   lisinopril 10 MG tablet Commonly known as: ZESTRIL Take 1 tablet by mouth once daily   OSTEO COMPLEX PO Take by mouth.         Objective:   BP 130/74   Pulse 79   Temp 98.3 F (36.8 C)   Ht 5' 5"$  (1.651 m)   Wt 124 lb (56.2 kg)   SpO2 96%   BMI 20.63 kg/m   Wt Readings from Last 3 Encounters:  05/10/22 124 lb (56.2 kg)  10/10/21 127 lb 9.6 oz (57.9 kg)  03/15/21 137 lb 6.4 oz (62.3 kg)    Physical Exam Vitals reviewed.  Constitutional:      General: She is not in acute distress.    Appearance: She is well-developed. She is not diaphoretic.  HENT:     Right Ear: Tympanic membrane, ear canal and external ear normal.     Left Ear: Tympanic membrane, ear canal and external ear normal.     Nose: Mucosal edema present. No rhinorrhea.  Right Sinus: No maxillary sinus tenderness or frontal sinus tenderness.     Left Sinus: No maxillary sinus tenderness or frontal sinus tenderness.     Mouth/Throat:     Pharynx: Uvula midline. No oropharyngeal exudate or posterior oropharyngeal erythema.     Tonsils: No tonsillar abscesses.  Eyes:     Conjunctiva/sclera: Conjunctivae normal.  Cardiovascular:     Rate and Rhythm: Normal rate and regular rhythm.     Heart sounds: Normal heart sounds. No murmur heard. Pulmonary:     Effort: Pulmonary effort is normal. No respiratory distress.     Breath sounds: Normal breath sounds. No wheezing.  Skin:    General: Skin is warm and dry.     Findings: No rash.  Neurological:     Mental Status: She is alert and oriented to person, place, and time.     Coordination: Coordination normal.  Psychiatric:        Behavior: Behavior normal.     Rapid strep: negative  Assessment & Plan:   Problem  List Items Addressed This Visit   None Visit Diagnoses     Pharyngitis, unspecified etiology    -  Primary   Relevant Orders   Rapid Strep Screen ([facility] ONLY)       Recommend salt water gargles and increase fluid intake and ibuprofen. Follow up plan: Return if symptoms worsen or fail to improve.  Counseling provided for all of the vaccine components Orders Placed This Encounter  Procedures   Rapid Strep Screen ([facility] ONLY)    Caryl Pina, MD [facility] 05/10/2022, 4:16 PM

## 2022-05-22 ENCOUNTER — Encounter: Payer: Self-pay | Admitting: Family

## 2022-05-22 ENCOUNTER — Ambulatory Visit: Payer: BC Managed Care – PPO | Admitting: Family

## 2022-05-22 VITALS — BP 121/74 | HR 60 | Temp 97.1°F | Ht 65.0 in | Wt 125.5 lb

## 2022-05-22 DIAGNOSIS — M810 Age-related osteoporosis without current pathological fracture: Secondary | ICD-10-CM

## 2022-05-22 DIAGNOSIS — Z23 Encounter for immunization: Secondary | ICD-10-CM

## 2022-05-22 DIAGNOSIS — Z122 Encounter for screening for malignant neoplasm of respiratory organs: Secondary | ICD-10-CM

## 2022-05-22 DIAGNOSIS — E7800 Pure hypercholesterolemia, unspecified: Secondary | ICD-10-CM | POA: Diagnosis not present

## 2022-05-22 DIAGNOSIS — Z720 Tobacco use: Secondary | ICD-10-CM

## 2022-05-22 DIAGNOSIS — I10 Essential (primary) hypertension: Secondary | ICD-10-CM | POA: Diagnosis not present

## 2022-05-22 LAB — CMP14+EGFR
ALT: 14 IU/L (ref 0–32)
AST: 20 IU/L (ref 0–40)
Albumin/Globulin Ratio: 1.7 (ref 1.2–2.2)
Albumin: 4.1 g/dL (ref 3.9–4.9)
Alkaline Phosphatase: 85 IU/L (ref 44–121)
BUN/Creatinine Ratio: 14 (ref 12–28)
BUN: 9 mg/dL (ref 8–27)
Bilirubin Total: <0.2 mg/dL (ref 0.0–1.2)
CO2: 25 mmol/L (ref 20–29)
Calcium: 9.5 mg/dL (ref 8.7–10.3)
Chloride: 101 mmol/L (ref 96–106)
Creatinine, Ser: 0.66 mg/dL (ref 0.57–1.00)
Globulin, Total: 2.4 g/dL (ref 1.5–4.5)
Glucose: 75 mg/dL (ref 70–99)
Potassium: 4.5 mmol/L (ref 3.5–5.2)
Sodium: 140 mmol/L (ref 134–144)
Total Protein: 6.5 g/dL (ref 6.0–8.5)
eGFR: 98 mL/min/{1.73_m2} (ref >59)

## 2022-05-22 MED ORDER — LISINOPRIL 10 MG PO TABS: 10.0000 mg | ORAL_TABLET | Freq: Every day | ORAL | 0 refills | Status: DC

## 2022-05-22 MED ORDER — ATORVASTATIN CALCIUM 40 MG PO TABS
40.0000 mg | ORAL_TABLET | Freq: Every day | ORAL | 0 refills | Status: DC
Start: 2022-05-22 — End: 2022-10-30

## 2022-05-22 MED ORDER — ALENDRONATE SODIUM 70 MG PO TABS: 70.0000 mg | ORAL_TABLET | ORAL | 4 refills | Status: DC

## 2022-05-22 NOTE — Progress Notes (Signed)
Subjective:    Patient ID: Julie Wiggins, female    DOB: 12-17-1955, 67 y.o.   MRN: [id number]  Chief Complaint  Patient presents with   Medical Management of Chronic Issues   Pt presents to the office today for chronic follow up.  She has osteoporosis and taking fosamax weekly. However, states her prescription was expired and has not had it in a few months.  Her last dexascan was 08/27/20.  Hypertension This is a chronic problem. The current episode started more than 1 year ago. The problem has been resolved since onset. The problem is controlled. Pertinent negatives include no malaise/fatigue, peripheral edema or shortness of breath. Risk factors for coronary artery disease include sedentary lifestyle and smoking/tobacco exposure. The current treatment provides moderate improvement.  Hyperlipidemia This is a chronic problem. The current episode started more than 1 year ago. The problem is controlled. Recent lipid tests were reviewed and are normal. Pertinent negatives include no shortness of breath. Current antihyperlipidemic treatment includes statins. The current treatment provides moderate improvement of lipids. Risk factors for coronary artery disease include dyslipidemia, hypertension, a sedentary lifestyle and post-menopausal.  Nicotine Dependence Presents for follow-up visit. Her urge triggers include company of smokers. The symptoms have been stable. She smokes 1 pack of cigarettes per day.      Review of Systems  Constitutional:  Negative for malaise/fatigue.  Respiratory:  Negative for shortness of breath.   All other systems reviewed and are negative.      Objective:   Physical Exam Vitals reviewed.  Constitutional:      General: She is not in acute distress.    Appearance: She is well-developed.  HENT:     Head: Normocephalic and atraumatic.     Right Ear: Tympanic membrane normal.     Left Ear: Tympanic membrane normal.  Eyes:     Pupils: Pupils are equal, round,  and reactive to light.  Neck:     Thyroid: No thyromegaly.  Cardiovascular:     Rate and Rhythm: Normal rate and regular rhythm.     Heart sounds: Normal heart sounds. No murmur heard. Pulmonary:     Effort: Pulmonary effort is normal. No respiratory distress.     Breath sounds: Normal breath sounds. No wheezing.  Abdominal:     General: Bowel sounds are normal. There is no distension.     Palpations: Abdomen is soft.     Tenderness: There is no abdominal tenderness.  Musculoskeletal:        General: No tenderness. Normal range of motion.     Cervical back: Normal range of motion and neck supple.  Skin:    General: Skin is warm and dry.  Neurological:     Mental Status: She is alert and oriented to person, place, and time.     Cranial Nerves: No cranial nerve deficit.     Deep Tendon Reflexes: Reflexes are normal and symmetric.  Psychiatric:        Behavior: Behavior normal.        Thought Content: Thought content normal.        Judgment: Judgment normal.       BP 121/74   Pulse 60   Temp (!) 97.1 F (36.2 C) (Temporal)   Ht '5\' 5"'$  (1.651 m)   Wt 125 lb 8 oz (56.9 kg)   SpO2 97%   BMI 20.88 kg/m      Assessment & Plan:  Estephania Fredrich comes in today with chief complaint of Medical  Management of Chronic Issues   Diagnosis and orders addressed:  1. Primary hypertension - lisinopril (ZESTRIL) 10 MG tablet; Take 1 tablet (10 mg total) by mouth daily.  Dispense: 90 tablet; Refill: 0 - CMP14+EGFR  2. Hypercholesterolemia - atorvastatin (LIPITOR) 40 MG tablet; Take 1 tablet (40 mg total) by mouth daily.  Dispense: 90 tablet; Refill: 0 - CMP14+EGFR  3. Osteoporosis, unspecified osteoporosis type, unspecified pathological fracture presence - alendronate (FOSAMAX) 70 MG tablet; Take 1 tablet (70 mg total) by mouth every 7 (seven) days. Take with a full glass of water on an empty stomach.  Dispense: 12 tablet; Refill: 4 - CMP14+EGFR  4. Tobacco use - CT CHEST LUNG CA  SCREEN LOW DOSE W/O CM; Future - CMP14+EGFR  5. Screening for lung cancer - CT CHEST LUNG CA SCREEN LOW DOSE W/O CM; Future - CMP14+EGFR   Labs pending Continue current medications  Health Maintenance reviewed Diet and exercise encouraged  Follow up plan: 6 months    Evelina Dun, FNP

## 2022-05-22 NOTE — Patient Instructions (Signed)

## 2022-06-22 DIAGNOSIS — H59811 Chorioretinal scars after surgery for detachment, right eye: Secondary | ICD-10-CM | POA: Diagnosis not present

## 2022-06-22 DIAGNOSIS — H43813 Vitreous degeneration, bilateral: Secondary | ICD-10-CM | POA: Diagnosis not present

## 2022-06-22 DIAGNOSIS — H4313 Vitreous hemorrhage, bilateral: Secondary | ICD-10-CM | POA: Diagnosis not present

## 2022-10-16 ENCOUNTER — Other Ambulatory Visit: Payer: Self-pay | Admitting: Family

## 2022-10-16 DIAGNOSIS — I10 Essential (primary) hypertension: Secondary | ICD-10-CM

## 2022-10-28 ENCOUNTER — Other Ambulatory Visit: Payer: Self-pay | Admitting: Family

## 2022-10-28 DIAGNOSIS — E7800 Pure hypercholesterolemia, unspecified: Secondary | ICD-10-CM

## 2022-11-23 ENCOUNTER — Telehealth: Payer: Self-pay | Admitting: Family

## 2022-11-23 ENCOUNTER — Ambulatory Visit: Payer: BC Managed Care – PPO | Admitting: Family

## 2022-11-23 NOTE — Telephone Encounter (Signed)
Pt calling to check on her FMLA ppw. She says that she did pay on 11/13/2022 and ppw needs to be submitted today 11/23/2022. Pt says that she is at work if nurse calls back she can talk at 11:00.

## 2022-11-23 NOTE — Telephone Encounter (Signed)
LMOVM this was sent on 11/15/22 if her company does not have it to let me know and will refax it.

## 2022-11-30 ENCOUNTER — Ambulatory Visit: Payer: BC Managed Care – PPO | Admitting: Family

## 2022-12-07 ENCOUNTER — Other Ambulatory Visit (INDEPENDENT_AMBULATORY_CARE_PROVIDER_SITE_OTHER): Payer: BC Managed Care – PPO

## 2022-12-07 ENCOUNTER — Ambulatory Visit: Payer: BC Managed Care – PPO | Admitting: Family

## 2022-12-07 ENCOUNTER — Encounter: Payer: Self-pay | Admitting: Family

## 2022-12-07 VITALS — BP 122/73 | HR 58 | Temp 97.3°F | Ht 65.0 in | Wt 117.6 lb

## 2022-12-07 DIAGNOSIS — K5900 Constipation, unspecified: Secondary | ICD-10-CM

## 2022-12-07 DIAGNOSIS — Z0000 Encounter for general adult medical examination without abnormal findings: Secondary | ICD-10-CM | POA: Diagnosis not present

## 2022-12-07 DIAGNOSIS — I10 Essential (primary) hypertension: Secondary | ICD-10-CM

## 2022-12-07 DIAGNOSIS — Z23 Encounter for immunization: Secondary | ICD-10-CM

## 2022-12-07 DIAGNOSIS — Z122 Encounter for screening for malignant neoplasm of respiratory organs: Secondary | ICD-10-CM

## 2022-12-07 DIAGNOSIS — M810 Age-related osteoporosis without current pathological fracture: Secondary | ICD-10-CM | POA: Diagnosis not present

## 2022-12-07 DIAGNOSIS — Z720 Tobacco use: Secondary | ICD-10-CM

## 2022-12-07 DIAGNOSIS — E7800 Pure hypercholesterolemia, unspecified: Secondary | ICD-10-CM

## 2022-12-07 DIAGNOSIS — Z0001 Encounter for general adult medical examination with abnormal findings: Secondary | ICD-10-CM | POA: Diagnosis not present

## 2022-12-07 LAB — LIPID PANEL

## 2022-12-07 MED ORDER — LUBIPROSTONE 8 MCG PO CAPS: 8.0000 ug | ORAL_CAPSULE | Freq: Two times a day (BID) | ORAL | 2 refills | Status: DC

## 2022-12-07 MED ORDER — ATORVASTATIN CALCIUM 40 MG PO TABS: 40.0000 mg | ORAL_TABLET | Freq: Every day | ORAL | 0 refills | Status: DC

## 2022-12-07 MED ORDER — LISINOPRIL 10 MG PO TABS: 10.0000 mg | ORAL_TABLET | Freq: Every day | ORAL | 0 refills | Status: DC

## 2022-12-07 MED ORDER — ALENDRONATE SODIUM 70 MG PO TABS: 70.0000 mg | ORAL_TABLET | ORAL | 4 refills | Status: DC

## 2022-12-07 NOTE — Patient Instructions (Signed)

## 2022-12-07 NOTE — Progress Notes (Signed)
Subjective:    Patient ID: Julie Wiggins, female    DOB: 07-16-1955, 67 y.o.   MRN: [id number]  Chief Complaint  Patient presents with   Annual Exam    Wants something for constipation   Pt presents to the office today for chronic follow up.    She has osteoporosis and taking fosamax weekly. However, states her prescription was expired and has not had it in a few months.  Her last dexascan was 08/27/20.   She is complaining of constipation and has tried Miralax, enemas, and OTC medications without success.   Hypertension This is a chronic problem. The current episode started more than 1 year ago. The problem has been resolved since onset. The problem is controlled. Pertinent negatives include no malaise/fatigue, peripheral edema or shortness of breath. Risk factors for coronary artery disease include dyslipidemia and smoking/tobacco exposure. The current treatment provides moderate improvement.  Hyperlipidemia This is a chronic problem. The current episode started more than 1 year ago. The problem is controlled. Recent lipid tests were reviewed and are normal. Pertinent negatives include no shortness of breath. Current antihyperlipidemic treatment includes statins. The current treatment provides moderate improvement of lipids. Risk factors for coronary artery disease include dyslipidemia, hypertension, a sedentary lifestyle and post-menopausal.  Nicotine Dependence Presents for follow-up visit. Her urge triggers include company of smokers. The symptoms have been stable. She smokes 1 pack of cigarettes per day.  Constipation This is a new problem. The current episode started more than 1 year ago. Her stool frequency is 2 to 3 times per week. She has tried laxatives, enemas and stool softeners for the symptoms. The treatment provided mild relief.      Review of Systems  Constitutional:  Negative for malaise/fatigue.  Respiratory:  Negative for shortness of breath.   Gastrointestinal:   Positive for constipation.  All other systems reviewed and are negative.   Family History  Problem Relation Age of Onset   Cancer Mother        ovarian   Diabetes Father    Colon cancer Neg Hx    Esophageal cancer Neg Hx    Rectal cancer Neg Hx    Stomach cancer Neg Hx    Colon polyps Neg Hx    Social History   Socioeconomic History   Marital status: Widowed    Spouse name: Not on file   Number of children: Not on file   Years of education: Not on file   Highest education level: Not on file  Occupational History   Not on file  Tobacco Use   Smoking status: Every Day    Current packs/day: 1.00    Average packs/day: 1 pack/day for 46.9 years (46.9 ttl pk-yrs)    Types: Cigarettes    Start date: 01/22/1976   Smokeless tobacco: Never  Vaping Use   Vaping status: Never Used  Substance and Sexual Activity   Alcohol use: Not Currently    Comment: once yearly   Drug use: No   Sexual activity: Not Currently  Other Topics Concern   Not on file  Social History Narrative   Not on file   Social Determinants of Health   Financial Resource Strain: Not on file  Food Insecurity: Not on file  Transportation Needs: Not on file  Physical Activity: Not on file  Stress: Not on file  Social Connections: Not on file       Objective:   Physical Exam Vitals reviewed.  Constitutional:  General: She is not in acute distress.    Appearance: She is well-developed.  HENT:     Head: Normocephalic and atraumatic.     Right Ear: Tympanic membrane normal.     Left Ear: Tympanic membrane normal.  Eyes:     Pupils: Pupils are equal, round, and reactive to light.  Neck:     Thyroid: No thyromegaly.  Cardiovascular:     Rate and Rhythm: Normal rate and regular rhythm.     Heart sounds: Normal heart sounds. No murmur heard. Pulmonary:     Effort: Pulmonary effort is normal. No respiratory distress.     Breath sounds: Normal breath sounds. No wheezing.  Abdominal:     General:  Bowel sounds are normal. There is no distension.     Palpations: Abdomen is soft.     Tenderness: There is no abdominal tenderness.  Musculoskeletal:        General: No tenderness. Normal range of motion.     Cervical back: Normal range of motion and neck supple.  Skin:    General: Skin is warm and dry.  Neurological:     Mental Status: She is alert and oriented to person, place, and time.     Cranial Nerves: No cranial nerve deficit.     Deep Tendon Reflexes: Reflexes are normal and symmetric.  Psychiatric:        Behavior: Behavior normal.        Thought Content: Thought content normal.        Judgment: Judgment normal.       Blood pressure 122/73, pulse (!) 58, temperature (!) 97.3 F (36.3 C), temperature source Temporal, height 5\' 5"  (1.651 m), weight 117 lb 9.6 oz (53.3 kg), SpO2 97%.     Assessment & Plan:  Mandeep Matza comes in today with chief complaint of Annual Exam (Wants something for constipation)   Diagnosis and orders addressed:  1. Osteoporosis, unspecified osteoporosis type, unspecified pathological fracture presence - alendronate (FOSAMAX) 70 MG tablet; Take 1 tablet (70 mg total) by mouth every 7 (seven) days. Take with a full glass of water on an empty stomach.  Dispense: 12 tablet; Refill: 4 - CBC with Differential/Platelet - CMP14+EGFR - DG [facility] DEXA  2. Hypercholesterolemia - atorvastatin (LIPITOR) 40 MG tablet; Take 1 tablet (40 mg total) by mouth daily.  Dispense: 90 tablet; Refill: 0 - CBC with Differential/Platelet - CMP14+EGFR  3. Primary hypertension - lisinopril (ZESTRIL) 10 MG tablet; Take 1 tablet (10 mg total) by mouth daily.  Dispense: 90 tablet; Refill: 0 - CBC with Differential/Platelet - CMP14+EGFR  4. Annual physical exam - CBC with Differential/Platelet - Lipid panel - CMP14+EGFR - TSH  5. Tobacco use - CBC with Differential/Platelet - CMP14+EGFR  6. Constipation, unspecified constipation type Start Amitiza 8 mcg  Force  fluids  - lubiprostone (AMITIZA) 8 MCG capsule; Take 1 capsule (8 mcg total) by mouth 2 (two) times daily with a meal.  Dispense: 60 capsule; Refill: 2 - CBC with Differential/Platelet - CMP14+EGFR  7. Encounter for screening for lung cancer - Ambulatory Referral Lung Cancer Screening [facility]   Labs pending Continue current medications  Health Maintenance reviewed Diet and exercise encouraged  Follow up plan: 6 months    Jannifer Rodney, FNP

## 2022-12-08 DIAGNOSIS — Z780 Asymptomatic menopausal state: Secondary | ICD-10-CM | POA: Diagnosis not present

## 2022-12-08 DIAGNOSIS — M810 Age-related osteoporosis without current pathological fracture: Secondary | ICD-10-CM | POA: Diagnosis not present

## 2022-12-08 LAB — CMP14+EGFR
ALT: 13 IU/L (ref 0–32)
AST: 23 IU/L (ref 0–40)
Albumin: 4 g/dL (ref 3.9–4.9)
Alkaline Phosphatase: 76 IU/L (ref 44–121)
BUN/Creatinine Ratio: 16 (ref 12–28)
BUN: 10 mg/dL (ref 8–27)
Bilirubin Total: 0.2 mg/dL (ref 0.0–1.2)
CO2: 25 mmol/L (ref 20–29)
Calcium: 8.9 mg/dL (ref 8.7–10.3)
Chloride: 100 mmol/L (ref 96–106)
Creatinine, Ser: 0.63 mg/dL (ref 0.57–1.00)
Globulin, Total: 2.3 g/dL (ref 1.5–4.5)
Glucose: 77 mg/dL (ref 70–99)
Potassium: 4.8 mmol/L (ref 3.5–5.2)
Sodium: 138 mmol/L (ref 134–144)
Total Protein: 6.3 g/dL (ref 6.0–8.5)
eGFR: 99 mL/min/{1.73_m2} (ref >59)

## 2022-12-08 LAB — LIPID PANEL
Chol/HDL Ratio: 2.1 ratio (ref 0.0–4.4)
Cholesterol, Total: 145 mg/dL (ref 100–199)
HDL: 68 mg/dL (ref >39)
LDL Chol Calc (NIH): 66 mg/dL (ref 0–99)
Triglycerides: 52 mg/dL (ref 0–149)
VLDL Cholesterol Cal: 11 mg/dL (ref 5–40)

## 2022-12-08 LAB — CBC WITH DIFFERENTIAL/PLATELET
Basophils Absolute: 0.1 10*3/uL (ref 0.0–0.2)
Basos: 1 %
EOS (ABSOLUTE): 0.4 10*3/uL (ref 0.0–0.4)
Eos: 5 %
Hematocrit: 41.7 % (ref 34.0–46.6)
Hemoglobin: 13.8 g/dL (ref 11.1–15.9)
Immature Grans (Abs): 0 10*3/uL (ref 0.0–0.1)
Immature Granulocytes: 0 %
Lymphocytes Absolute: 2.9 10*3/uL (ref 0.7–3.1)
Lymphs: 38 %
MCH: 32.5 pg (ref 26.6–33.0)
MCHC: 33.1 g/dL (ref 31.5–35.7)
MCV: 98 fL — ABNORMAL HIGH (ref 79–97)
Monocytes Absolute: 0.5 10*3/uL (ref 0.1–0.9)
Monocytes: 6 %
Neutrophils Absolute: 3.9 10*3/uL (ref 1.4–7.0)
Neutrophils: 50 %
Platelets: 272 10*3/uL (ref 150–450)
RBC: 4.24 x10E6/uL (ref 3.77–5.28)
RDW: 10.9 % — ABNORMAL LOW (ref 11.7–15.4)
WBC: 7.8 10*3/uL (ref 3.4–10.8)

## 2022-12-08 LAB — TSH: TSH: 2.24 u[IU]/mL (ref 0.450–4.500)

## 2022-12-19 ENCOUNTER — Telehealth: Payer: Self-pay | Admitting: Family

## 2022-12-21 ENCOUNTER — Telehealth: Payer: Self-pay

## 2022-12-21 NOTE — Telephone Encounter (Signed)
*  Primary  Pharmacy Patient Advocate Encounter   Received notification from CoverMyMeds that prior authorization for Lubiprostone capsules  is required/requested.   Insurance verification completed.   The patient is insured through [facility] .   Per test claim: PA required; PA started via CoverMyMeds. KEY [id number] . Waiting for clinical questions to populate.

## 2022-12-29 ENCOUNTER — Other Ambulatory Visit (HOSPITAL_COMMUNITY): Payer: Self-pay

## 2022-12-29 NOTE — Telephone Encounter (Signed)
Pharmacy Patient Advocate Encounter  Received notification from [facility] that Prior Authorization for Lubiprostone has been DENIED.  No reason given; No denial letter received via Fax or CMM. It has been requested and will be uploaded to the media tab once received.   PA #/Case ID/Reference #: Called [id number] for denial letter-will be faxing it to [place] at [phone]

## 2022-12-29 NOTE — Telephone Encounter (Signed)
I have tried to contact Patient x2 after 3 PM and have received no call back.

## 2023-01-01 NOTE — Telephone Encounter (Signed)
Can you try to call patient and let her know.

## 2023-01-08 DIAGNOSIS — H5210 Myopia, unspecified eye: Secondary | ICD-10-CM | POA: Diagnosis not present

## 2023-01-08 DIAGNOSIS — H5213 Myopia, bilateral: Secondary | ICD-10-CM | POA: Diagnosis not present

## 2023-01-08 DIAGNOSIS — H524 Presbyopia: Secondary | ICD-10-CM | POA: Diagnosis not present

## 2023-01-08 DIAGNOSIS — H2513 Age-related nuclear cataract, bilateral: Secondary | ICD-10-CM | POA: Diagnosis not present

## 2023-01-08 DIAGNOSIS — H52223 Regular astigmatism, bilateral: Secondary | ICD-10-CM | POA: Diagnosis not present

## 2023-01-15 ENCOUNTER — Other Ambulatory Visit (HOSPITAL_COMMUNITY): Payer: Self-pay

## 2023-01-15 NOTE — Telephone Encounter (Signed)
Received Denial letter, however it just says the pt did not meet the definition of Medical necessity based on the members benefits booklet.  The PA questionnaire did ask if the pt had tried and failed [employer]. I don't see that she has, so that may be one of the reasons it was denied. Can you give clinical reasoning as to why she can't try it or why it wouldn't be appropriate?  (However- It will also require a PA)

## 2023-04-18 ENCOUNTER — Other Ambulatory Visit: Payer: Self-pay | Admitting: Family

## 2023-04-18 DIAGNOSIS — I10 Essential (primary) hypertension: Secondary | ICD-10-CM

## 2023-05-01 ENCOUNTER — Other Ambulatory Visit: Payer: Self-pay | Admitting: Family

## 2023-05-01 DIAGNOSIS — E7800 Pure hypercholesterolemia, unspecified: Secondary | ICD-10-CM

## 2023-05-01 NOTE — Telephone Encounter (Signed)
[employer] in March for 6 mos FU RF sent to pharmacy

## 2023-05-01 NOTE — Telephone Encounter (Signed)
Pt scheduled for 05/24/2023 with Christy.

## 2023-05-24 ENCOUNTER — Ambulatory Visit: Payer: BC Managed Care – PPO | Admitting: Family

## 2023-06-18 ENCOUNTER — Encounter: Payer: Self-pay | Admitting: Family

## 2023-06-18 ENCOUNTER — Ambulatory Visit: Payer: BC Managed Care – PPO | Admitting: Family

## 2023-06-18 VITALS — BP 138/55 | HR 58 | Temp 97.0°F | Ht 65.0 in | Wt 114.0 lb

## 2023-06-18 DIAGNOSIS — M810 Age-related osteoporosis without current pathological fracture: Secondary | ICD-10-CM

## 2023-06-18 DIAGNOSIS — I10 Essential (primary) hypertension: Secondary | ICD-10-CM | POA: Diagnosis not present

## 2023-06-18 DIAGNOSIS — Z720 Tobacco use: Secondary | ICD-10-CM

## 2023-06-18 DIAGNOSIS — K5900 Constipation, unspecified: Secondary | ICD-10-CM | POA: Insufficient documentation

## 2023-06-18 DIAGNOSIS — E7800 Pure hypercholesterolemia, unspecified: Secondary | ICD-10-CM

## 2023-06-18 MED ORDER — LINACLOTIDE 72 MCG PO CAPS: 72.0000 ug | ORAL_CAPSULE | Freq: Every day | ORAL | 1 refills | Status: AC

## 2023-06-18 MED ORDER — LISINOPRIL 10 MG PO TABS: 10.0000 mg | ORAL_TABLET | Freq: Every day | ORAL | 2 refills | Status: DC

## 2023-06-18 MED ORDER — ATORVASTATIN CALCIUM 40 MG PO TABS: 40.0000 mg | ORAL_TABLET | Freq: Every day | ORAL | 2 refills | Status: AC

## 2023-06-18 MED ORDER — ALENDRONATE SODIUM 70 MG PO TABS: 70.0000 mg | ORAL_TABLET | ORAL | 4 refills | Status: AC

## 2023-06-18 NOTE — Patient Instructions (Signed)

## 2023-06-18 NOTE — Progress Notes (Signed)
 Subjective:    Patient ID: Julie Wiggins, female    DOB: June 04, 1955, 68 y.o.   MRN: [id number]  Chief Complaint  Patient presents with   Medical Management of Chronic Issues   Pt presents to the office today for chronic follow up.    She has osteoporosis and taking fosamax weekly. However, states her prescription was expired and has not had it in a few months.  Her last dexascan was 12/07/22.   She is complaining of constipation and has tried Miralax, enemas, and OTC medications without success.  We sent in Amitiza, but was not covered by insurance.  Hypertension This is a chronic problem. The current episode started more than 1 year ago. The problem has been resolved since onset. The problem is controlled. Pertinent negatives include no malaise/fatigue, peripheral edema or shortness of breath. Risk factors for coronary artery disease include dyslipidemia and smoking/tobacco exposure. The current treatment provides moderate improvement.  Hyperlipidemia This is a chronic problem. The current episode started more than 1 year ago. The problem is controlled. Recent lipid tests were reviewed and are normal. Pertinent negatives include no shortness of breath. Current antihyperlipidemic treatment includes statins. The current treatment provides moderate improvement of lipids. Risk factors for coronary artery disease include dyslipidemia, hypertension, a sedentary lifestyle and post-menopausal.  Nicotine Dependence Presents for follow-up visit. The symptoms have been stable. She smokes 1 pack of cigarettes per day.  Constipation This is a chronic problem. The current episode started more than 1 year ago. Her stool frequency is 2 to 3 times per week. She has tried laxatives, enemas and stool softeners for the symptoms. The treatment provided mild relief.      Review of Systems  Constitutional:  Negative for malaise/fatigue.  Respiratory:  Negative for shortness of breath.   Gastrointestinal:   Positive for constipation.  All other systems reviewed and are negative.   Family History  Problem Relation Age of Onset   Cancer Mother        ovarian   Diabetes Father    Colon cancer Neg Hx    Esophageal cancer Neg Hx    Rectal cancer Neg Hx    Stomach cancer Neg Hx    Colon polyps Neg Hx    Social History   Socioeconomic History   Marital status: Widowed    Spouse name: Not on file   Number of children: Not on file   Years of education: Not on file   Highest education level: Not on file  Occupational History   Not on file  Tobacco Use   Smoking status: Every Day    Current packs/day: 1.00    Average packs/day: 1 pack/day for 47.4 years (47.4 ttl pk-yrs)    Types: Cigarettes    Start date: 01/22/1976   Smokeless tobacco: Never  Vaping Use   Vaping status: Never Used  Substance and Sexual Activity   Alcohol use: Not Currently    Comment: once yearly   Drug use: No   Sexual activity: Not Currently  Other Topics Concern   Not on file  Social History Narrative   Not on file   Social Drivers of Health   Financial Resource Strain: Not on file  Food Insecurity: Not on file  Transportation Needs: Not on file  Physical Activity: Not on file  Stress: Not on file  Social Connections: Not on file       Objective:   Physical Exam Vitals reviewed.  Constitutional:  General: She is not in acute distress.    Appearance: She is well-developed.  HENT:     Head: Normocephalic and atraumatic.     Right Ear: Tympanic membrane normal.     Left Ear: Tympanic membrane normal.  Eyes:     Pupils: Pupils are equal, round, and reactive to light.  Neck:     Thyroid: No thyromegaly.  Cardiovascular:     Rate and Rhythm: Normal rate and regular rhythm.     Heart sounds: Normal heart sounds. No murmur heard. Pulmonary:     Effort: Pulmonary effort is normal. No respiratory distress.     Breath sounds: Normal breath sounds. No wheezing.  Abdominal:     General: Bowel  sounds are normal. There is no distension.     Palpations: Abdomen is soft.     Tenderness: There is no abdominal tenderness.  Musculoskeletal:        General: No tenderness. Normal range of motion.     Cervical back: Normal range of motion and neck supple.  Skin:    General: Skin is warm and dry.  Neurological:     Mental Status: She is alert and oriented to person, place, and time.     Cranial Nerves: No cranial nerve deficit.     Deep Tendon Reflexes: Reflexes are normal and symmetric.  Psychiatric:        Behavior: Behavior normal.        Thought Content: Thought content normal.        Judgment: Judgment normal.       Blood pressure (!) 138/55, pulse (!) 58, temperature (!) 97 F (36.1 C), height 5\' 5"  (1.651 m), weight 114 lb (51.7 kg), SpO2 93%.     Assessment & Plan:  Julie Wiggins comes in today with chief complaint of Medical Management of Chronic Issues   Diagnosis and orders addressed:  1. Osteoporosis, unspecified osteoporosis type, unspecified pathological fracture presence - alendronate (FOSAMAX) 70 MG tablet; Take 1 tablet (70 mg total) by mouth every 7 (seven) days. Take with a full glass of water on an empty stomach.  Dispense: 12 tablet; Refill: 4 - CMP14+EGFR  2. Hypercholesterolemia - atorvastatin (LIPITOR) 40 MG tablet; Take 1 tablet (40 mg total) by mouth daily.  Dispense: 90 tablet; Refill: 2 - CMP14+EGFR  3. Primary hypertension (Primary) - lisinopril (ZESTRIL) 10 MG tablet; Take 1 tablet (10 mg total) by mouth daily.  Dispense: 90 tablet; Refill: 2 - CMP14+EGFR  4. Tobacco use - CMP14+EGFR  5. Constipation, unspecified constipation type Will order Linzess Force fluids - linaclotide (LINZESS) 72 MCG capsule; Take 1 capsule (72 mcg total) by mouth daily before breakfast.  Dispense: 90 capsule; Refill: 1 - CMP14+EGFR   Labs pending Start Linzess  Continue current medications  Health Maintenance reviewed Diet and exercise encouraged  Follow  up plan: 6 months    Jannifer Rodney, FNP

## 2023-06-19 LAB — CMP14+EGFR
ALT: 10 IU/L (ref 0–32)
AST: 20 IU/L (ref 0–40)
Albumin: 3.9 g/dL (ref 3.9–4.9)
Alkaline Phosphatase: 103 IU/L (ref 44–121)
BUN/Creatinine Ratio: 10 — ABNORMAL LOW (ref 12–28)
BUN: 7 mg/dL — ABNORMAL LOW (ref 8–27)
Bilirubin Total: 0.3 mg/dL (ref 0.0–1.2)
CO2: 27 mmol/L (ref 20–29)
Calcium: 9.6 mg/dL (ref 8.7–10.3)
Chloride: 101 mmol/L (ref 96–106)
Creatinine, Ser: 0.69 mg/dL (ref 0.57–1.00)
Globulin, Total: 2.7 g/dL (ref 1.5–4.5)
Glucose: 81 mg/dL (ref 70–99)
Potassium: 4.6 mmol/L (ref 3.5–5.2)
Sodium: 142 mmol/L (ref 134–144)
Total Protein: 6.6 g/dL (ref 6.0–8.5)
eGFR: 96 mL/min/{1.73_m2} (ref >59)

## 2023-06-26 ENCOUNTER — Other Ambulatory Visit (HOSPITAL_COMMUNITY): Payer: Self-pay

## 2023-06-26 ENCOUNTER — Telehealth: Payer: Self-pay

## 2023-06-26 NOTE — Telephone Encounter (Signed)
*  Primary  Pharmacy Patient Advocate Encounter   Received notification from CoverMyMeds that prior authorization for Linzess capsules  is required/requested.   Insurance verification completed.   The patient is insured through [facility] .   Per test claim: The current 90 day co-pay is, $35.00.  No PA needed at this time. This test claim was processed through [facility]- copay amounts may vary at other pharmacies due to pharmacy/plan contracts, or as the patient moves through the different stages of their insurance plan.

## 2023-06-30 ENCOUNTER — Telehealth: Admitting: Family

## 2023-06-30 DIAGNOSIS — J209 Acute bronchitis, unspecified: Secondary | ICD-10-CM | POA: Diagnosis not present

## 2023-06-30 MED ORDER — BENZONATATE 100 MG PO CAPS: 100.0000 mg | ORAL_CAPSULE | Freq: Three times a day (TID) | ORAL | 0 refills | Status: AC | PRN

## 2023-06-30 MED ORDER — PREDNISONE 10 MG (21) PO TBPK: ORAL_TABLET | ORAL | 0 refills | Status: AC

## 2023-06-30 NOTE — Progress Notes (Signed)
 E-Visit for Cough   We are sorry that you are not feeling well.  Here is how we plan to help!  Based on your presentation I believe you most likely have A cough due to a virus.  This is called viral bronchitis and is best treated by rest, plenty of fluids and control of the cough.  You may use Ibuprofen or Tylenol as directed to help your symptoms.     In addition you may use A non-prescription cough medication called Robitussin DAC. Take 2 teaspoons every 8 hours or Delsym: take 2 teaspoons every 12 hours., A non-prescription cough medication called Mucinex DM: take 2 tablets every 12 hours., and A prescription cough medication called Tessalon Perles 100mg . You may take 1-2 capsules every 8 hours as needed for your cough.  Prednisone 10 mg daily for 6 days (see taper instructions below)  Directions for 6 day taper: Day 1: 2 tablets before breakfast, 1 after both lunch & dinner and 2 at bedtime Day 2: 1 tab before breakfast, 1 after both lunch & dinner and 2 at bedtime Day 3: 1 tab at each meal & 1 at bedtime Day 4: 1 tab at breakfast, 1 at lunch, 1 at bedtime Day 5: 1 tab at breakfast & 1 tab at bedtime Day 6: 1 tab at breakfast  From your responses in the eVisit questionnaire you describe inflammation in the upper respiratory tract which is causing a significant cough.  This is commonly called Bronchitis and has four common causes:   Allergies Viral Infections Acid Reflux Bacterial Infection Allergies, viruses and acid reflux are treated by controlling symptoms or eliminating the cause. An example might be a cough caused by taking certain blood pressure medications. You stop the cough by changing the medication. Another example might be a cough caused by acid reflux. Controlling the reflux helps control the cough.  USE OF BRONCHODILATOR ("RESCUE") INHALERS: There is a risk from using your bronchodilator too frequently.  The risk is that over-reliance on a medication which only relaxes  the muscles surrounding the breathing tubes can reduce the effectiveness of medications prescribed to reduce swelling and congestion of the tubes themselves.  Although you feel brief relief from the bronchodilator inhaler, your asthma may actually be worsening with the tubes becoming more swollen and filled with mucus.  This can delay other crucial treatments, such as oral steroid medications. If you need to use a bronchodilator inhaler daily, several times per day, you should discuss this with your provider.  There are probably better treatments that could be used to keep your asthma under control.     HOME CARE Only take medications as instructed by your medical team. Complete the entire course of an antibiotic. Drink plenty of fluids and get plenty of rest. Avoid close contacts especially the very young and the elderly Cover your mouth if you cough or cough into your sleeve. Always remember to wash your hands A steam or ultrasonic humidifier can help congestion.   GET HELP RIGHT AWAY IF: You develop worsening fever. You become short of breath You cough up blood. Your symptoms persist after you have completed your treatment plan MAKE SURE YOU  Understand these instructions. Will watch your condition. Will get help right away if you are not doing well or get worse.    Thank you for choosing an e-visit.  Your e-visit answers were reviewed by a board certified advanced clinical practitioner to complete your personal care plan. Depending upon the condition, your  plan could have included both over the counter or prescription medications.  Please review your pharmacy choice. Make sure the pharmacy is open so you can pick up prescription now. If there is a problem, you may contact your provider through [employer] and have the prescription routed to another pharmacy.  Your safety is important to [place]. If you have drug allergies check your prescription carefully.   For the next 24 hours you  can use MyChart to ask questions about today's visit, request a non-urgent call back, or ask for a work or school excuse. You will get an email in the next two days asking about your experience. I hope that your e-visit has been valuable and will speed your recovery.  Approximately 5 minutes was spent documenting and reviewing patient's chart.

## 2023-07-19 DIAGNOSIS — H4313 Vitreous hemorrhage, bilateral: Secondary | ICD-10-CM | POA: Diagnosis not present

## 2023-07-19 DIAGNOSIS — H43813 Vitreous degeneration, bilateral: Secondary | ICD-10-CM | POA: Diagnosis not present

## 2023-07-19 DIAGNOSIS — H35412 Lattice degeneration of retina, left eye: Secondary | ICD-10-CM | POA: Diagnosis not present

## 2023-07-19 DIAGNOSIS — H59811 Chorioretinal scars after surgery for detachment, right eye: Secondary | ICD-10-CM | POA: Diagnosis not present

## 2023-08-27 ENCOUNTER — Other Ambulatory Visit: Payer: Self-pay | Admitting: Family

## 2023-08-27 ENCOUNTER — Ambulatory Visit
Admission: RE | Admit: 2023-08-27 | Discharge: 2023-08-27 | Disposition: A | Discharge status: Home / Self Care | Source: Ambulatory Visit | Attending: Family | Admitting: Family

## 2023-08-27 DIAGNOSIS — Z1231 Encounter for screening mammogram for malignant neoplasm of breast: Secondary | ICD-10-CM

## 2024-02-12 DIAGNOSIS — H5213 Myopia, bilateral: Secondary | ICD-10-CM | POA: Diagnosis not present

## 2024-02-12 DIAGNOSIS — H2513 Age-related nuclear cataract, bilateral: Secondary | ICD-10-CM | POA: Diagnosis not present

## 2024-02-12 DIAGNOSIS — H524 Presbyopia: Secondary | ICD-10-CM | POA: Diagnosis not present

## 2024-02-12 DIAGNOSIS — H52223 Regular astigmatism, bilateral: Secondary | ICD-10-CM | POA: Diagnosis not present

## 2024-04-15 ENCOUNTER — Other Ambulatory Visit: Payer: Self-pay | Admitting: Family

## 2024-04-15 DIAGNOSIS — I10 Essential (primary) hypertension: Secondary | ICD-10-CM
